# Patient Record
Sex: Male | Born: 1970
Health system: Southern US, Community
[De-identification: ages and names within clinical notes are randomized; demographics above are authoritative.]

## PROBLEM LIST (undated history)

## (undated) DIAGNOSIS — I10 Essential (primary) hypertension: Secondary | ICD-10-CM

---

## 1998-02-08 DIAGNOSIS — I10 Essential (primary) hypertension: Secondary | ICD-10-CM

## 1998-02-08 HISTORY — DX: Essential (primary) hypertension: I10

## 2007-03-18 ENCOUNTER — Emergency Department (HOSPITAL_COMMUNITY): Admission: EM | Admit: 2007-03-18 | Discharge: 2007-03-18 | Payer: Self-pay | Admitting: Emergency Medicine

## 2007-07-17 ENCOUNTER — Emergency Department (HOSPITAL_COMMUNITY): Admission: EM | Admit: 2007-07-17 | Discharge: 2007-07-17 | Payer: Self-pay | Admitting: Emergency Medicine

## 2009-02-25 ENCOUNTER — Emergency Department (HOSPITAL_COMMUNITY): Admission: EM | Admit: 2009-02-25 | Discharge: 2009-02-25 | Payer: Self-pay | Admitting: Emergency Medicine

## 2009-08-25 ENCOUNTER — Emergency Department (HOSPITAL_COMMUNITY): Admission: EM | Admit: 2009-08-25 | Discharge: 2009-08-26 | Payer: Self-pay | Admitting: Emergency Medicine

## 2009-08-25 ENCOUNTER — Ambulatory Visit: Payer: Self-pay | Admitting: Psychiatry

## 2010-01-15 ENCOUNTER — Inpatient Hospital Stay (HOSPITAL_COMMUNITY): Admission: EM | Admit: 2010-01-15 | Discharge: 2009-08-28 | Payer: Self-pay | Admitting: Psychiatry

## 2010-04-25 LAB — CBC
Hemoglobin: 15.2 g/dL (ref 13.0–17.0)
MCH: 31.3 pg (ref 26.0–34.0)
MCHC: 34.8 g/dL (ref 30.0–36.0)
Platelets: 202 10*3/uL (ref 150–400)
RBC: 4.85 MIL/uL (ref 4.22–5.81)
RDW: 13.5 % (ref 11.5–15.5)
WBC: 13.3 10*3/uL — ABNORMAL HIGH (ref 4.0–10.5)

## 2010-04-25 LAB — POCT I-STAT, CHEM 8
BUN: 11 mg/dL (ref 6–23)
Calcium, Ion: 1.07 mmol/L — ABNORMAL LOW (ref 1.12–1.32)
Chloride: 104 mEq/L (ref 96–112)
Creatinine, Ser: 1.3 mg/dL (ref 0.4–1.5)
HCT: 47 % (ref 39.0–52.0)
Hemoglobin: 16 g/dL (ref 13.0–17.0)
Potassium: 3.7 mEq/L (ref 3.5–5.1)
Sodium: 136 mEq/L (ref 135–145)

## 2010-04-25 LAB — DIFFERENTIAL
Basophils Relative: 0 % (ref 0–1)
Eosinophils Relative: 0 % (ref 0–5)
Monocytes Relative: 3 % (ref 3–12)

## 2010-04-25 LAB — URINALYSIS, ROUTINE W REFLEX MICROSCOPIC
Hgb urine dipstick: NEGATIVE
Specific Gravity, Urine: 1.015 (ref 1.005–1.030)
pH: 5 (ref 5.0–8.0)

## 2010-04-25 LAB — RAPID URINE DRUG SCREEN, HOSP PERFORMED
Amphetamines: NOT DETECTED
Barbiturates: NOT DETECTED
Benzodiazepines: POSITIVE — AB
Opiates: NOT DETECTED
Tetrahydrocannabinol: POSITIVE — AB

## 2010-04-25 LAB — ETHANOL: Alcohol, Ethyl (B): 24 mg/dL — ABNORMAL HIGH (ref 0–10)

## 2010-04-26 LAB — URINALYSIS, ROUTINE W REFLEX MICROSCOPIC
Bilirubin Urine: NEGATIVE
Ketones, ur: NEGATIVE mg/dL
Specific Gravity, Urine: 1.018 (ref 1.005–1.030)
Urobilinogen, UA: 0.2 mg/dL (ref 0.0–1.0)

## 2010-04-26 LAB — COMPREHENSIVE METABOLIC PANEL
AST: 25 U/L (ref 0–37)
Albumin: 4 g/dL (ref 3.5–5.2)
Alkaline Phosphatase: 54 U/L (ref 39–117)
Calcium: 9.1 mg/dL (ref 8.4–10.5)
Potassium: 3.8 mEq/L (ref 3.5–5.1)
Total Bilirubin: 0.6 mg/dL (ref 0.3–1.2)
Total Protein: 7.4 g/dL (ref 6.0–8.3)

## 2010-04-26 LAB — DIFFERENTIAL
Basophils Relative: 1 % (ref 0–1)
Eosinophils Relative: 0 % (ref 0–5)
Lymphocytes Relative: 18 % (ref 12–46)
Lymphs Abs: 1.9 10*3/uL (ref 0.7–4.0)
Monocytes Relative: 6 % (ref 3–12)

## 2010-04-26 LAB — CBC
HCT: 44.9 % (ref 39.0–52.0)
Hemoglobin: 15.3 g/dL (ref 13.0–17.0)
MCHC: 34.1 g/dL (ref 30.0–36.0)
Platelets: 186 10*3/uL (ref 150–400)
RBC: 4.94 MIL/uL (ref 4.22–5.81)

## 2010-04-26 LAB — PROTIME-INR: Prothrombin Time: 12.5 seconds (ref 11.6–15.2)

## 2010-04-26 LAB — APTT: aPTT: 30 seconds (ref 24–37)

## 2010-04-26 LAB — POCT CARDIAC MARKERS
CKMB, poc: <1 ng/mL — ABNORMAL LOW (ref 1.0–8.0)
Myoglobin, poc: 70.7 ng/mL (ref 12–200)

## 2010-10-22 IMAGING — CR DG CHEST 2V
2 series · 2 of 2 positions shown · non-contrast
Comparison: 03/18/2007

CLINICAL DATA: Headache, left neck and shoulder pressure,
hypertension

CHEST - 2 VIEW

[w chest pa]
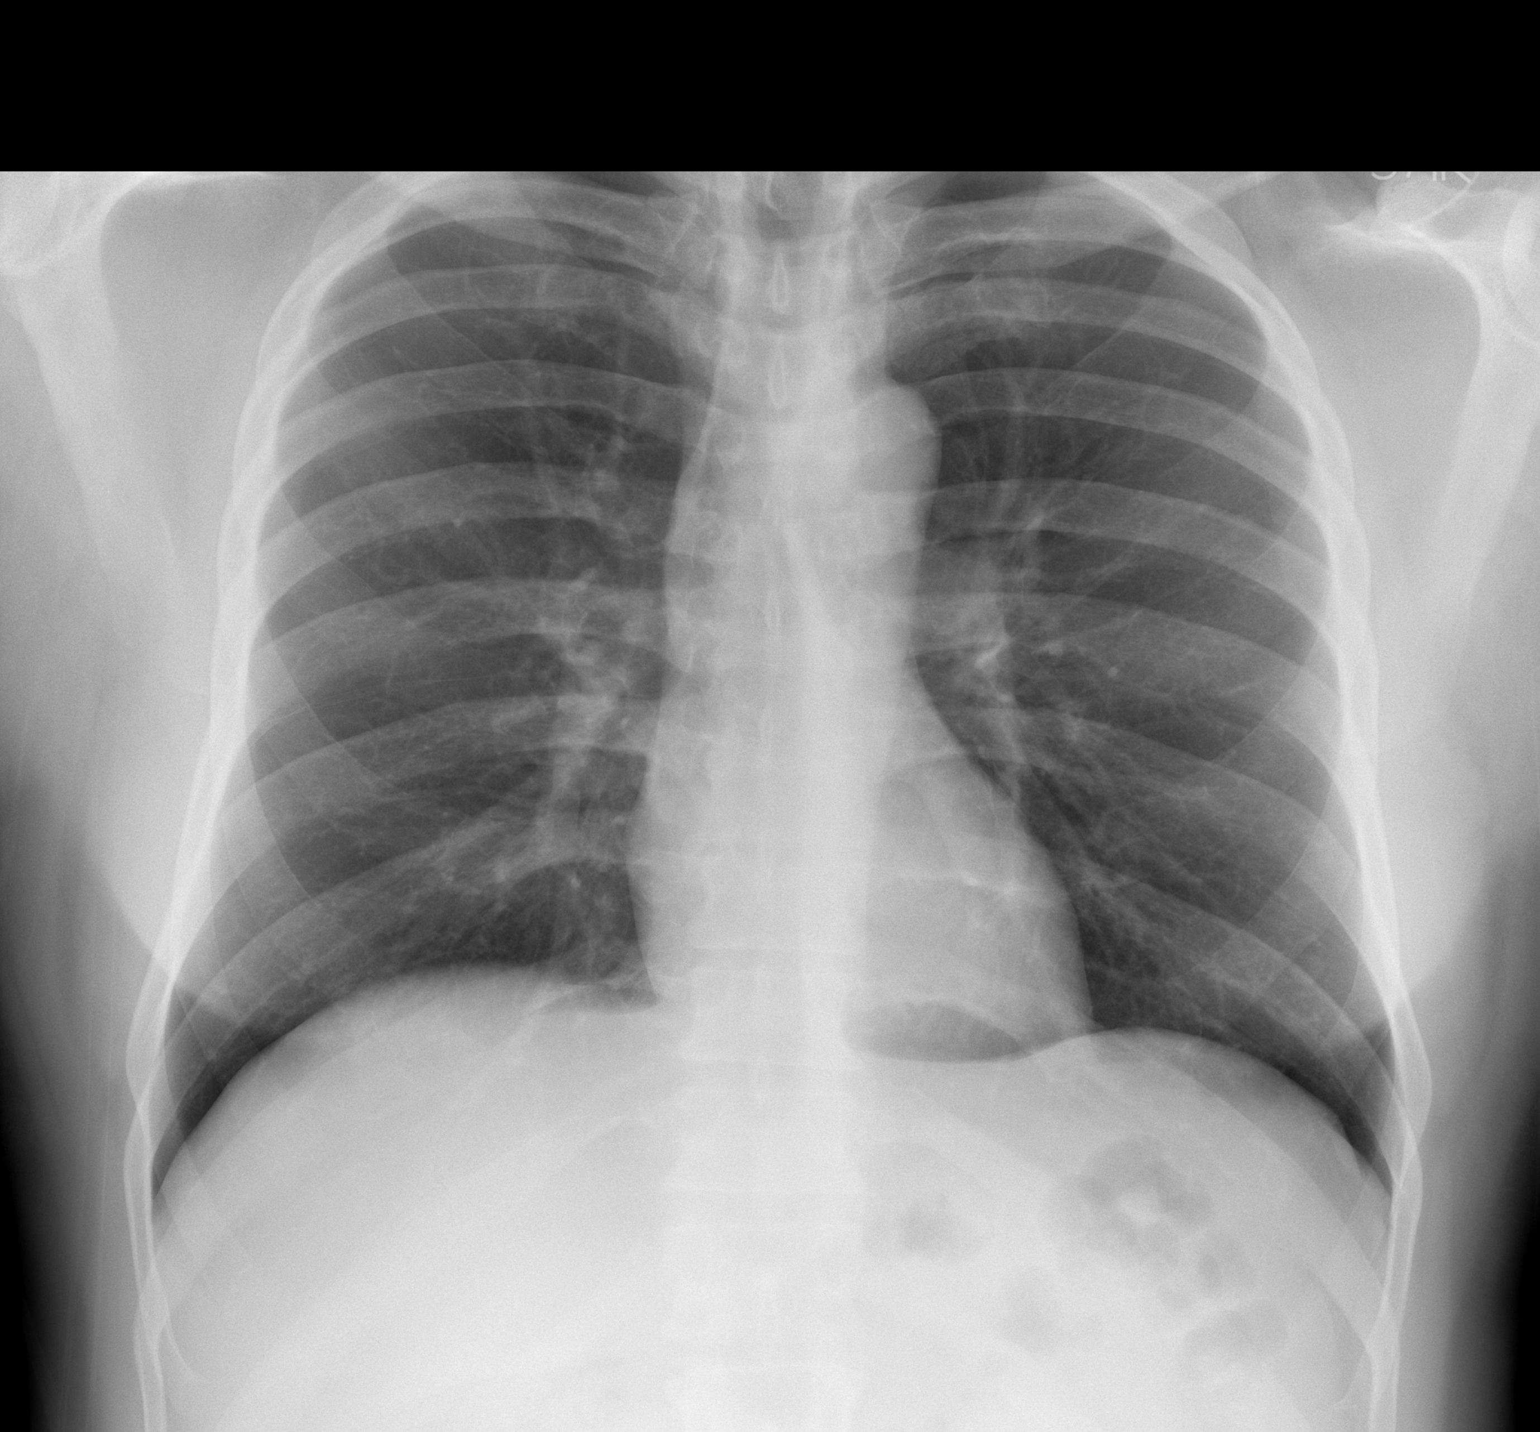

[w chest lat]
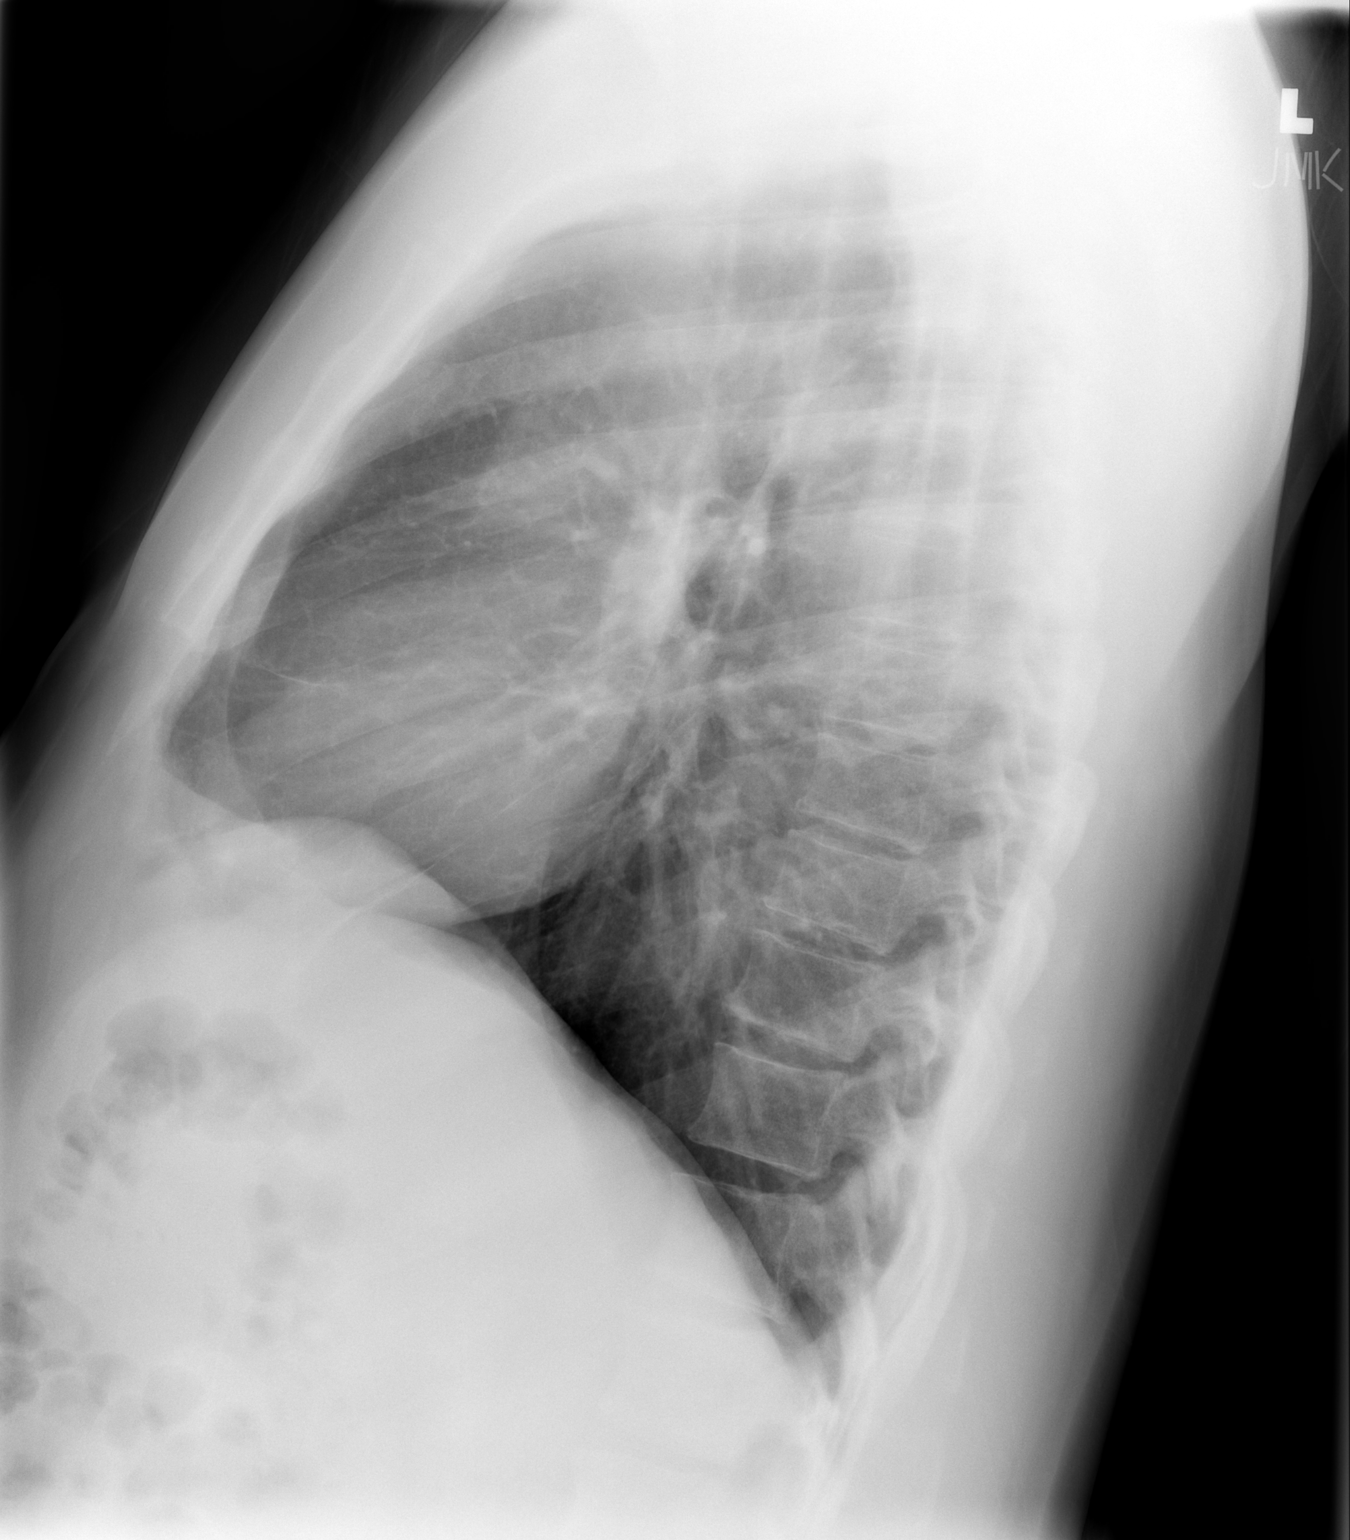

[2 of 2 positions shown; findings below may reference images not displayed]

FINDINGS: The heart size and mediastinal contours are within
normal limits.  Both lungs are clear.  The visualized skeletal
structures are unremarkable.
IMPRESSION: No active cardiopulmonary disease.

## 2010-10-22 IMAGING — CT CT HEAD W/O CM
1 of 2 series · 13 of 30 positions shown, 17 images · non-contrast
Comparison: None.

CLINICAL DATA: Hypertension, headache, shortness of breath

CT HEAD WITHOUT CONTRAST
TECHNIQUE: Contiguous axial images were obtained from the base of
the skull through the vertex without contrast

[Series 2: brain · axial · 0.49mm/px · z∈[+144,+278]mm · 13 of 32 slices shown, 17 images]
[im 3/32  brain]
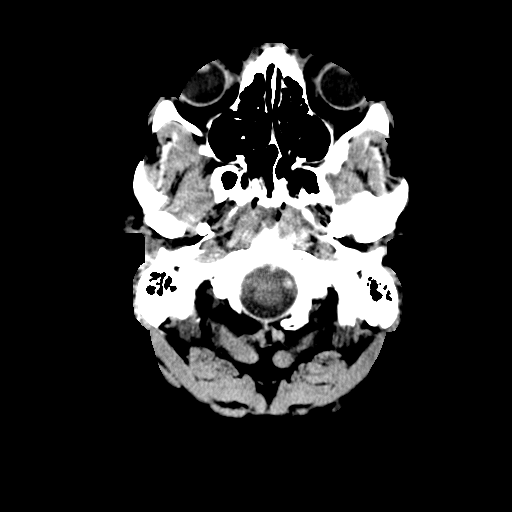
[im 3/32  bone]
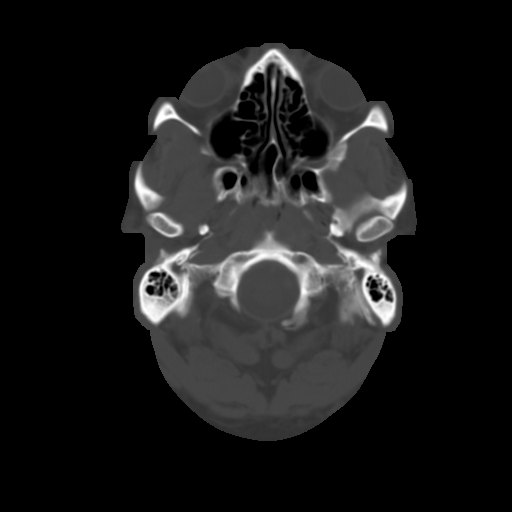
[im 5/32  brain]
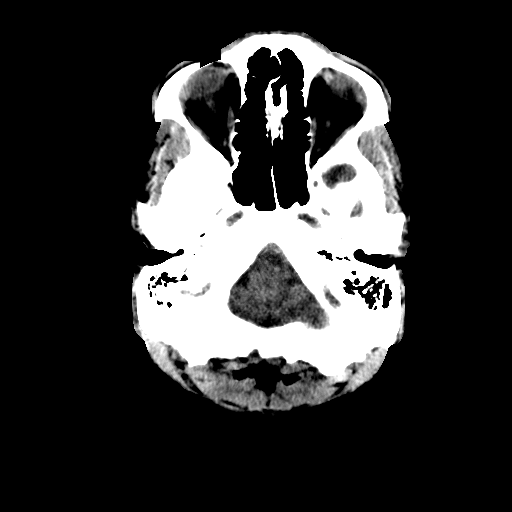
[im 7/32  brain]
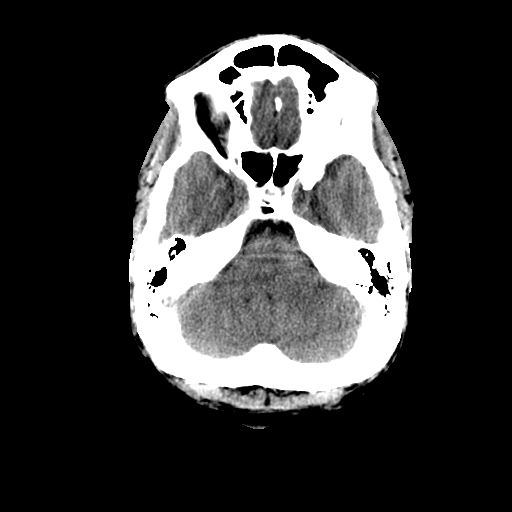
[im 9/32  brain]
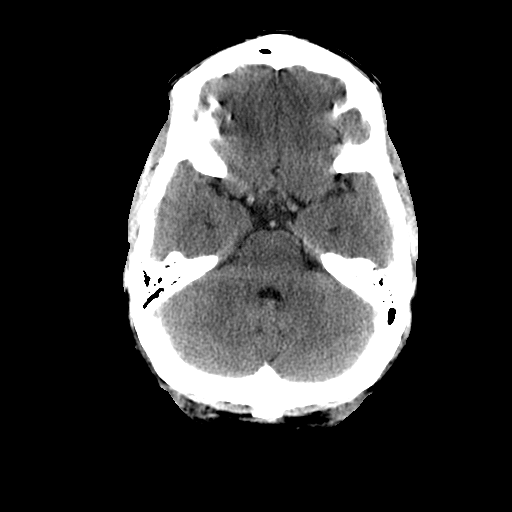
[im 12/32  brain]
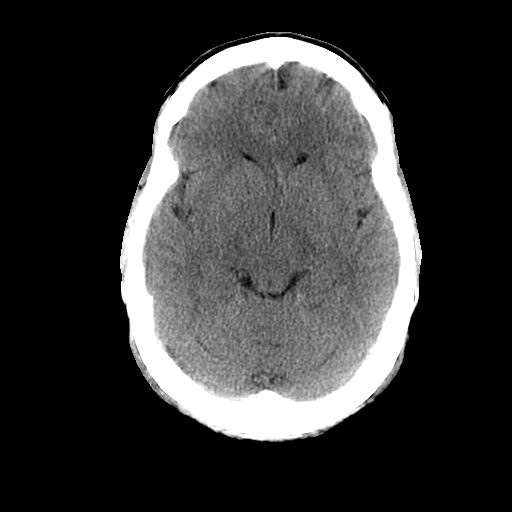
[im 12/32  bone]
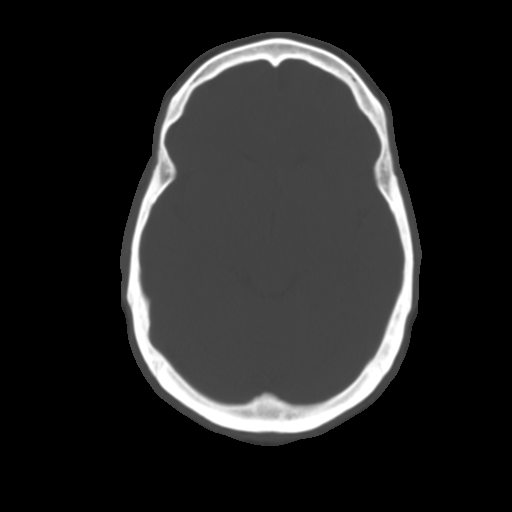
[im 14/32  brain]
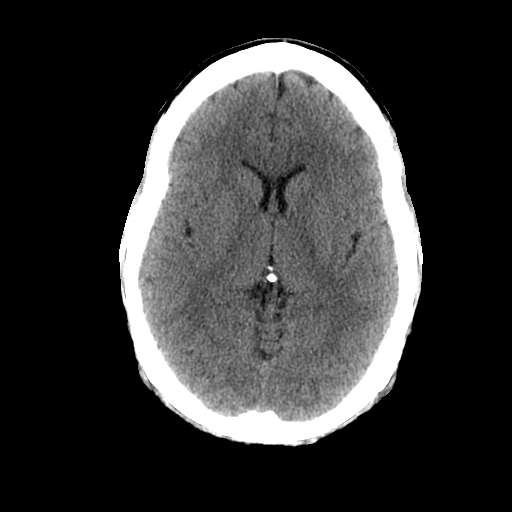
[im 16/32  brain]
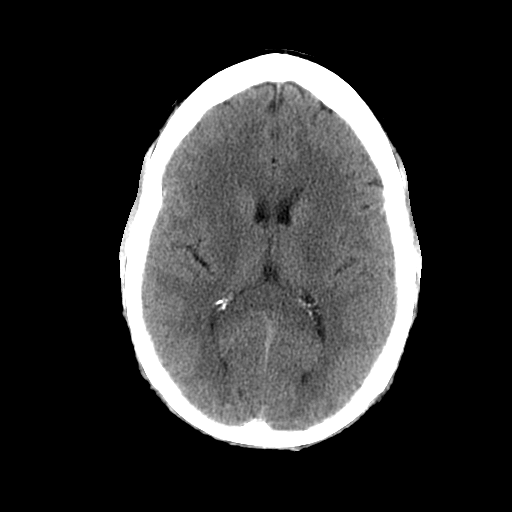
[im 18/32  brain]
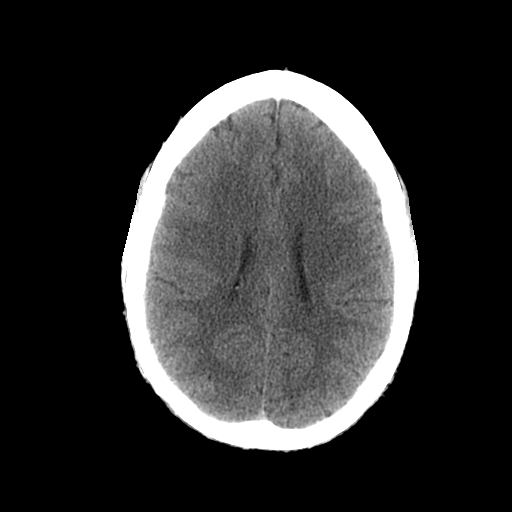
[im 20/32  brain]
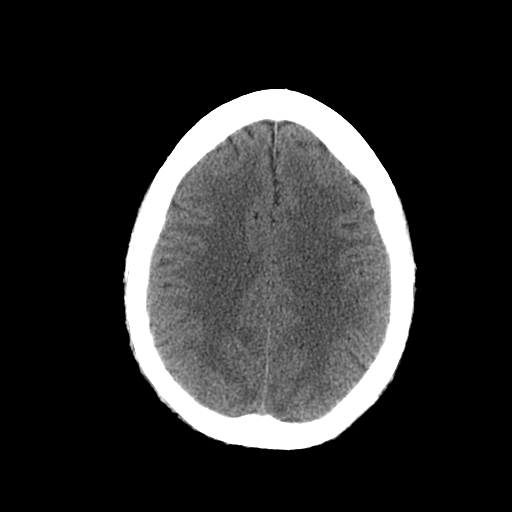
[im 20/32  bone]
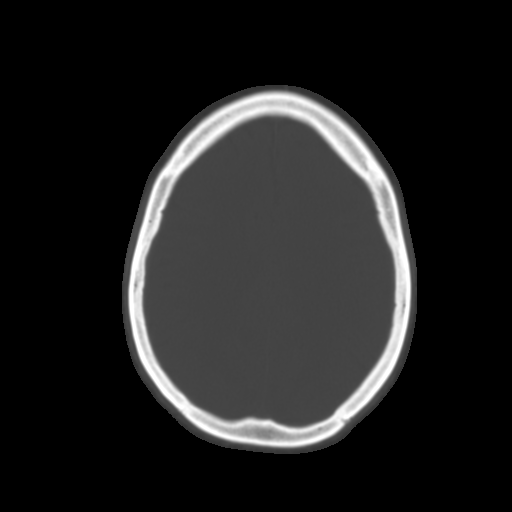
[im 23/32  brain]
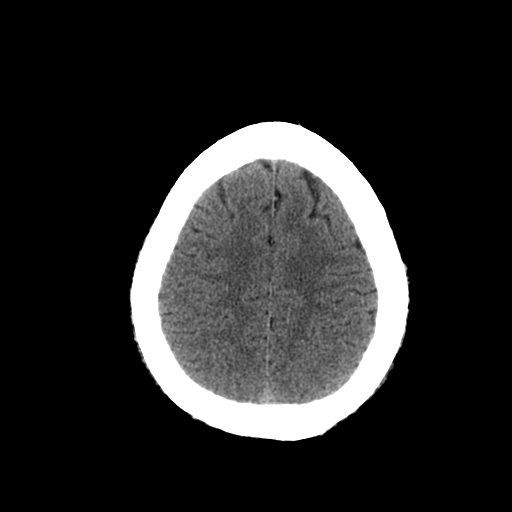
[im 25/32  brain]
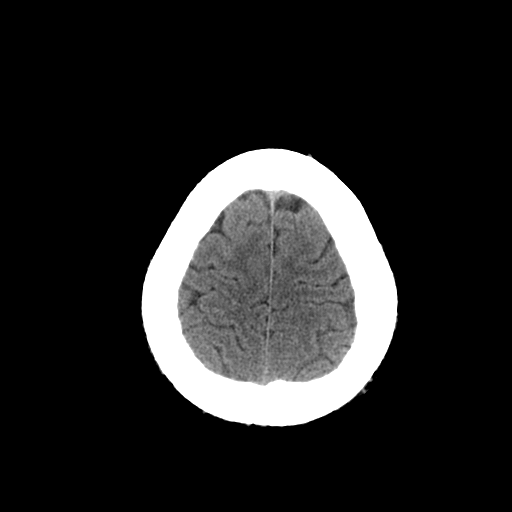
[im 27/32  brain]
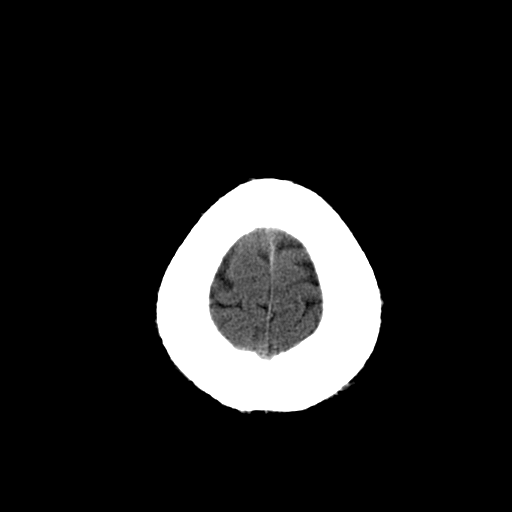
[im 29/32  brain]
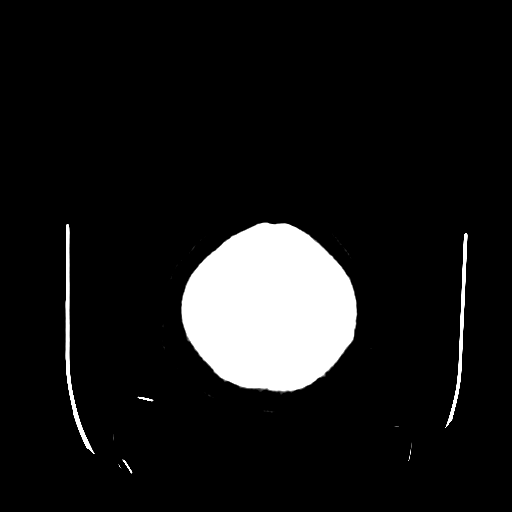
[im 29/32  bone]
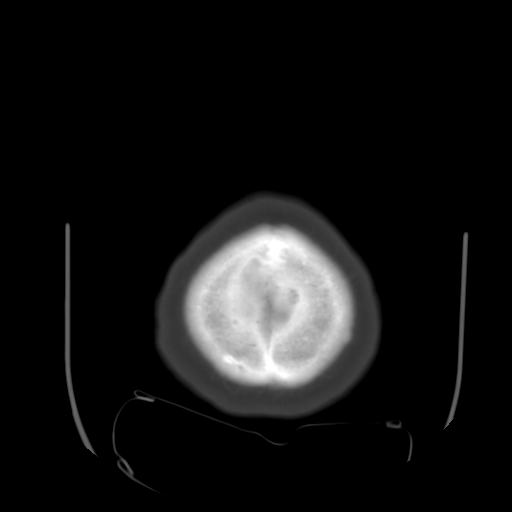

[13 of 30 positions shown; findings below may reference images not displayed]

FINDINGS: The brain has a normal appearance without evidence for
hemorrhage, acute infarction, hydrocephalus, or mass lesion.  There
is no extra axial fluid collection.  The skull and paranasal
sinuses are normal.
IMPRESSION: Normal CT of the head without contrast.

## 2012-05-26 ENCOUNTER — Encounter (HOSPITAL_COMMUNITY): Payer: Self-pay | Admitting: Emergency Medicine

## 2012-05-26 ENCOUNTER — Emergency Department (HOSPITAL_COMMUNITY)
Admission: EM | Admit: 2012-05-26 | Discharge: 2012-05-26 | Disposition: A | Discharge status: Home / Self Care | Payer: Self-pay | Attending: Emergency Medicine | Admitting: Emergency Medicine

## 2012-05-26 DIAGNOSIS — H538 Other visual disturbances: Secondary | ICD-10-CM | POA: Insufficient documentation

## 2012-05-26 DIAGNOSIS — R42 Dizziness and giddiness: Secondary | ICD-10-CM | POA: Insufficient documentation

## 2012-05-26 DIAGNOSIS — I10 Essential (primary) hypertension: Secondary | ICD-10-CM | POA: Insufficient documentation

## 2012-05-26 DIAGNOSIS — F329 Major depressive disorder, single episode, unspecified: Secondary | ICD-10-CM | POA: Insufficient documentation

## 2012-05-26 DIAGNOSIS — F17200 Nicotine dependence, unspecified, uncomplicated: Secondary | ICD-10-CM | POA: Insufficient documentation

## 2012-05-26 DIAGNOSIS — F3289 Other specified depressive episodes: Secondary | ICD-10-CM | POA: Insufficient documentation

## 2012-05-26 HISTORY — DX: Essential (primary) hypertension: I10

## 2012-05-26 LAB — POCT I-STAT, CHEM 8
Calcium, Ion: 1.25 mmol/L — ABNORMAL HIGH (ref 1.12–1.23)
Chloride: 106 mEq/L (ref 96–112)
Creatinine, Ser: 1.1 mg/dL (ref 0.50–1.35)
Glucose, Bld: 108 mg/dL — ABNORMAL HIGH (ref 70–99)
Potassium: 3.5 mEq/L (ref 3.5–5.1)
TCO2: 23 mmol/L (ref 0–100)

## 2012-05-26 MED ORDER — AMLODIPINE BESYLATE 5 MG PO TABS
5.0000 mg | ORAL_TABLET | Freq: Once | ORAL | Status: DC
  Filled 2012-05-26: qty 1

## 2012-05-26 MED ORDER — LISINOPRIL-HYDROCHLOROTHIAZIDE 20-25 MG PO TABS: 1.0000 | ORAL_TABLET | Freq: Every day | ORAL | Status: DC

## 2012-05-26 MED ORDER — SERTRALINE HCL 50 MG PO TABS: 50.0000 mg | ORAL_TABLET | Freq: Every day | ORAL | Status: DC

## 2012-05-26 MED ORDER — HYDROCHLOROTHIAZIDE 25 MG PO TABS
25.0000 mg | ORAL_TABLET | Freq: Once | ORAL | Status: AC
  Administered 2012-05-26: 25 mg via ORAL
  Filled 2012-05-26: qty 1

## 2012-05-26 MED ORDER — LISINOPRIL 20 MG PO TABS
20.0000 mg | ORAL_TABLET | Freq: Once | ORAL | Status: AC
  Administered 2012-05-26: 20 mg via ORAL
  Filled 2012-05-26: qty 1

## 2012-05-26 NOTE — ED Notes (Addendum)
Pt c/o intermittent headaches and blurred vision x 1 month. Pt reports being out of blood pressure medications for months. Pt does not have a PMD. Pt also reports being out of his antidepressant (Zoloft) medications x 1 year and is requesting a prescription. Pt denies SI/HI.

## 2012-05-26 NOTE — ED Provider Notes (Signed)
I saw and evaluated the patient, reviewed the resident's note and I agree with the findings and plan.   .Face to face Exam:  General:  Awake HEENT:  Atraumatic Resp:  Normal effort Abd:  Nondistended Neuro:No focal weakness    Nelia Shi, MD 05/26/12 1007

## 2012-05-26 NOTE — ED Provider Notes (Signed)
History     CSN: [id number]  Arrival date & time 05/26/12  1308   First MD Initiated Contact with Patient 05/26/12 [phone]      Chief Complaint  Patient presents with  . Headache  . Blurred Vision    (Consider location/radiation/quality/duration/timing/severity/associated sxs/prior treatment) HPI 42 yo M with HTN and depression presenting with intermittent headaches and blurry vision.  He states that he used to be on Norvasc and something that started with an L for his blood pressure, but he has not taken any of these medicines for at least 8 months due to losing his insurance.   The headaches are diffuse and throbbing, no associated photophobia or nausea.  They respond some to tylenol and motrin.  The blurry vision is intermittent and can make him feel a little dizzy.  Denies chest pain, shortness of breath, nausea, vomiting, gait abnormality, weakness.  He is also asking about getting back on Zoloft for his depression.  He has also been off of this for at least 8 months.  No SI/HI.   Past Medical History  Diagnosis Date  . Hypertension     History reviewed. No pertinent past surgical history.  No family history on file.  History  Substance Use Topics  . Smoking status: Current Every Day Smoker  . Smokeless tobacco: Not on file  . Alcohol Use: Yes      Review of Systems  Constitutional: Negative.   Eyes: Positive for visual disturbance. Negative for photophobia and pain.  Respiratory: Negative.   Cardiovascular: Negative.   Gastrointestinal: Negative.   Genitourinary: Negative.   Musculoskeletal: Negative.   Skin: Negative.   Neurological: Positive for dizziness and headaches. Negative for syncope, speech difficulty and weakness.    Allergies  Penicillins  Home Medications  No current outpatient prescriptions on file.  BP 186/129  Pulse 81  Temp(Src) 98.6 F (37 C) (Oral)  Resp 20  SpO2 96%  Physical Exam  Constitutional: He is oriented to person, place,  and time. He appears well-developed and well-nourished. No distress.  HENT:  Head: Normocephalic and atraumatic.  Right Ear: External ear normal.  Left Ear: External ear normal.  Mouth/Throat: Oropharynx is clear and moist. No oropharyngeal exudate.  Eyes: Conjunctivae and EOM are normal. Pupils are equal, round, and reactive to light. Right eye exhibits no discharge. Left eye exhibits no discharge. No scleral icterus.  Fundoscopic exam:      The right eye shows no hemorrhage and no papilledema.       The left eye shows no hemorrhage and no papilledema.  Cardiovascular: Normal rate, regular rhythm, normal heart sounds and intact distal pulses.   No murmur heard. Pulmonary/Chest: Effort normal and breath sounds normal. No respiratory distress. He has no wheezes. He has no rales.  Musculoskeletal: He exhibits no edema and no tenderness.  Neurological: He is alert and oriented to person, place, and time. No cranial nerve deficit. He exhibits normal muscle tone. Coordination normal.  Skin: Skin is warm and dry. No rash noted. He is not diaphoretic. No erythema.    ED Course  Procedures (including critical care time)  Labs Reviewed  POCT I-STAT, CHEM 8 - Abnormal; Notable for the following:    Glucose, Bld 108 (*)    Calcium, Ion 1.25 (*)    All other components within normal limits   No results found.   No diagnosis found.    MDM  42 yo M with HTN and depression who has been out of  medication for at least 8 months presenting with intermittent headaches and blurred vision.  BP 180s/120s on arrival.   No sign of hypertensive emergencies on exam.  Will check an i-stat for creatinine.  I am not sure if the headaches are related to spikes in his blood pressure or are tension vs migraine headaches.  If creatinine is okay, will start HCTZ 25mg  and Lisinopril 20mg  daily.  9:15AM: Patient reports feeling better.  He is anxious to leave, but has not received the BP meds yet.  Will give the  meds and recheck BP after 30 minutes.  If BP is stable to decreased, okay to discharge.  10:00AM: Blood pressure is stable and patient is asymptomatic.  Will d/c.   Phebe Colla, MD 05/26/12 1005

## 2013-02-02 ENCOUNTER — Emergency Department (HOSPITAL_COMMUNITY)
Admission: EM | Admit: 2013-02-02 | Discharge: 2013-02-02 | Disposition: A | Discharge status: Home / Self Care | Payer: Self-pay | Attending: Emergency Medicine | Admitting: Emergency Medicine

## 2013-02-02 ENCOUNTER — Encounter (HOSPITAL_COMMUNITY): Payer: Self-pay | Admitting: Emergency Medicine

## 2013-02-02 DIAGNOSIS — Z760 Encounter for issue of repeat prescription: Secondary | ICD-10-CM | POA: Insufficient documentation

## 2013-02-02 DIAGNOSIS — I10 Essential (primary) hypertension: Secondary | ICD-10-CM | POA: Insufficient documentation

## 2013-02-02 DIAGNOSIS — H538 Other visual disturbances: Secondary | ICD-10-CM | POA: Insufficient documentation

## 2013-02-02 DIAGNOSIS — Z880 Allergy status to penicillin: Secondary | ICD-10-CM | POA: Insufficient documentation

## 2013-02-02 DIAGNOSIS — F17200 Nicotine dependence, unspecified, uncomplicated: Secondary | ICD-10-CM | POA: Insufficient documentation

## 2013-02-02 DIAGNOSIS — R51 Headache: Secondary | ICD-10-CM | POA: Insufficient documentation

## 2013-02-02 DIAGNOSIS — Z79899 Other long term (current) drug therapy: Secondary | ICD-10-CM | POA: Insufficient documentation

## 2013-02-02 LAB — POCT I-STAT, CHEM 8
BUN: 16 mg/dL (ref 6–23)
Calcium, Ion: 1.22 mmol/L (ref 1.12–1.23)
Chloride: 103 mEq/L (ref 96–112)
Glucose, Bld: 109 mg/dL — ABNORMAL HIGH (ref 70–99)
HCT: 46 % (ref 39.0–52.0)
Hemoglobin: 15.6 g/dL (ref 13.0–17.0)
TCO2: 24 mmol/L (ref 0–100)

## 2013-02-02 MED ORDER — LISINOPRIL-HYDROCHLOROTHIAZIDE 20-25 MG PO TABS: 1.0000 | ORAL_TABLET | Freq: Every day | ORAL | Status: DC

## 2013-02-02 MED ORDER — SERTRALINE HCL 50 MG PO TABS: 50.0000 mg | ORAL_TABLET | Freq: Every day | ORAL | Status: DC

## 2013-02-02 NOTE — ED Notes (Signed)
Patient refused discharge vital signs. 

## 2013-02-02 NOTE — ED Provider Notes (Signed)
CSN: [id number]     Arrival date & time 02/02/13  7829 History   First MD Initiated Contact with Patient 02/02/13 [phone]     Chief Complaint  Patient presents with  . Hypertension   (Consider location/radiation/quality/duration/timing/severity/associated sxs/prior Treatment) HPI Patient with hx hypertension out of his medications for the past 6 months, p/w intermittent headaches and blurry vision, occasional sparkles in his visual field.  States that for the past several weeks he has headaches at night, located in the occiput, has applied ice without improvement, headaches are resolved during the day.  Notes he has also had blurry vision x several weeks.  Denies CP, SOB, leg swelling, palpitations, cough, focal neurological deficits.  Requests refills of BP medications and zoloft. Pt states he does have some depression, denies SI, HI.     Past Medical History  Diagnosis Date  . Hypertension    History reviewed. No pertinent past surgical history. No family history on file. History  Substance Use Topics  . Smoking status: Current Every Day Smoker  . Smokeless tobacco: Not on file  . Alcohol Use: Yes    Review of Systems  Constitutional: Negative for fever.  HENT: Negative for sore throat and trouble swallowing.   Eyes: Positive for visual disturbance (blurry, no diplopia).  Respiratory: Negative for cough and shortness of breath.   Cardiovascular: Negative for chest pain.  Gastrointestinal: Negative for nausea, vomiting, abdominal pain and diarrhea.  Musculoskeletal: Negative for gait problem.  Neurological: Positive for headaches. Negative for dizziness, syncope, weakness and numbness.  Psychiatric/Behavioral: Negative for suicidal ideas and confusion.    Allergies  Penicillins  Home Medications   Current Outpatient Rx  Name  Route  Sig  Dispense  Refill  . acetaminophen (TYLENOL) 500 MG tablet   Oral   Take 500 mg by mouth every 6 (six) hours as needed for pain.          Marland Kitchen ibuprofen (ADVIL,MOTRIN) 800 MG tablet   Oral   Take 800 mg by mouth daily as needed for pain.         Marland Kitchen lisinopril-hydrochlorothiazide (PRINZIDE,ZESTORETIC) 20-25 MG per tablet   Oral   Take 1 tablet by mouth daily.   30 tablet   1   . sertraline (ZOLOFT) 50 MG tablet   Oral   Take 1 tablet (50 mg total) by mouth daily.   30 tablet   1    BP 167/118  Pulse 87  Temp(Src) 98.5 F (36.9 C)  Resp 16  SpO2 99% Physical Exam  Nursing note and vitals reviewed. Constitutional: He appears well-developed and well-nourished. No distress.  HENT:  Head: Normocephalic and atraumatic.  Eyes: Conjunctivae and EOM are normal.  Fundoscopic exam is limited but without major abnormalities  Neck: Neck supple.  Cardiovascular: Normal rate, regular rhythm and intact distal pulses.   Pulmonary/Chest: Effort normal and breath sounds normal. No respiratory distress. He has no wheezes. He has no rales.  Abdominal: Soft. He exhibits no distension and no mass. There is no tenderness. There is no rebound and no guarding.  Musculoskeletal: Normal range of motion. He exhibits no edema.  Neurological: He is alert. He exhibits normal muscle tone.  CN II-XII intact, EOMs intact, no pronator drift, grip strengths equal bilaterally; strength 5/5 in all extremities, sensation intact in all extremities; finger to nose, heel to shin, rapid alternating movements normal; gait is normal.     Skin: He is not diaphoretic.  Psychiatric: He has a normal mood and  affect. His behavior is normal. Thought content normal.    ED Course  Procedures (including critical care time) Labs Review Labs Reviewed - No data to display Imaging Review No results found.  EKG Interpretation   None       MDM   1. Hypertension   2. Medication refill      Pt with known HTN, off of his medications due to lack of insurance and no PCP.  Will have insurance and PCP within a few weeks.  Neuro, cardiac exams normal.  Chem  8 unremarkable except for mildly elevated glucose.  D/C home with refills, PCP follow up.  Discussed result, findings, treatment, and follow up  with patient.  Pt given return precautions.  Pt verbalizes understanding and agrees with plan.      I doubt any other EMC precluding discharge at this time including, but not necessarily limited to the following: hypertensive emergency      Trixie Dredge, PA-C 02/02/13 [phone]

## 2013-02-02 NOTE — ED Provider Notes (Signed)
Medical screening examination/treatment/procedure(s) were performed by non-physician practitioner and as supervising physician I was immediately available for consultation/collaboration.  EKG Interpretation   None         Kristen N Ward, DO 02/02/13 1444 

## 2013-02-02 NOTE — ED Notes (Addendum)
Pt c/o hypertension.  Pt on different BP meds, but has struggled to keep BP under control.  Pt states he ran out of BP meds in May.

## 2013-04-03 ENCOUNTER — Encounter (HOSPITAL_COMMUNITY): Payer: Self-pay | Admitting: Emergency Medicine

## 2013-04-03 ENCOUNTER — Emergency Department (HOSPITAL_COMMUNITY)
Admission: EM | Admit: 2013-04-03 | Discharge: 2013-04-03 | Disposition: A | Discharge status: Home / Self Care | Payer: Self-pay | Attending: Emergency Medicine | Admitting: Emergency Medicine

## 2013-04-03 DIAGNOSIS — M5442 Lumbago with sciatica, left side: Secondary | ICD-10-CM

## 2013-04-03 DIAGNOSIS — Z0289 Encounter for other administrative examinations: Secondary | ICD-10-CM | POA: Insufficient documentation

## 2013-04-03 DIAGNOSIS — I10 Essential (primary) hypertension: Secondary | ICD-10-CM | POA: Insufficient documentation

## 2013-04-03 DIAGNOSIS — M5430 Sciatica, unspecified side: Secondary | ICD-10-CM | POA: Insufficient documentation

## 2013-04-03 DIAGNOSIS — Z880 Allergy status to penicillin: Secondary | ICD-10-CM | POA: Insufficient documentation

## 2013-04-03 DIAGNOSIS — Z79899 Other long term (current) drug therapy: Secondary | ICD-10-CM | POA: Insufficient documentation

## 2013-04-03 DIAGNOSIS — IMO0001 Reserved for inherently not codable concepts without codable children: Secondary | ICD-10-CM | POA: Insufficient documentation

## 2013-04-03 DIAGNOSIS — Z760 Encounter for issue of repeat prescription: Secondary | ICD-10-CM | POA: Insufficient documentation

## 2013-04-03 DIAGNOSIS — F17200 Nicotine dependence, unspecified, uncomplicated: Secondary | ICD-10-CM | POA: Insufficient documentation

## 2013-04-03 MED ORDER — LISINOPRIL-HYDROCHLOROTHIAZIDE 20-25 MG PO TABS: 1.0000 | ORAL_TABLET | Freq: Every day | ORAL | Status: DC

## 2013-04-03 MED ORDER — TRAMADOL HCL 50 MG PO TABS: 50.0000 mg | ORAL_TABLET | Freq: Four times a day (QID) | ORAL | Status: DC | PRN

## 2013-04-03 MED ORDER — CYCLOBENZAPRINE HCL 10 MG PO TABS: 10.0000 mg | ORAL_TABLET | Freq: Two times a day (BID) | ORAL | Status: DC | PRN

## 2013-04-03 MED ORDER — LISINOPRIL 20 MG PO TABS
20.0000 mg | ORAL_TABLET | Freq: Every day | ORAL | Status: DC
  Administered 2013-04-03: 20 mg via ORAL
  Filled 2013-04-03 (×2): qty 1

## 2013-04-03 MED ORDER — HYDROCHLOROTHIAZIDE 25 MG PO TABS
25.0000 mg | ORAL_TABLET | Freq: Every day | ORAL | Status: DC
  Administered 2013-04-03: 25 mg via ORAL
  Filled 2013-04-03: qty 1

## 2013-04-03 MED ORDER — SERTRALINE HCL 50 MG PO TABS: 50.0000 mg | ORAL_TABLET | Freq: Every day | ORAL | Status: DC

## 2013-04-03 NOTE — Discharge Instructions (Signed)
You may take pain medication as needed for back pain when acetaminophen and ibuprofen not helping. Please do not take pain medication while driving or operating heavy machinery as this medication causes drowsiness.  Be sure to follow up with primary care for recheck of lower back pain, hypertension, and medication refills.

## 2013-04-03 NOTE — ED Notes (Signed)
Pt reports lower back pain for 3 days. Denies injury. Hurts worst when he bends over to tie his shoes. Also need refill of lisinopril has been out for 1 week. Denies any CP. Pt is a x 4.

## 2013-04-03 NOTE — ED Provider Notes (Signed)
CSN: [id number]     Arrival date & time 04/03/13  1715 History   First MD Initiated Contact with Patient 04/03/13 1804     Chief Complaint  Patient presents with  . Back Pain  . Medication Refill     (Consider location/radiation/quality/duration/timing/severity/associated sxs/prior Treatment) HPI Pt is a 43yo male with hx of HTN presenting today with left lower back pain that started 3-5 days ago but gradually improving. Pt states left lower back is aching and sore, worse with certain movements. States pain at rest is 7/10, radiating into left leg stopping at his knee.  Has been taking tylenol with moderate relief. Denies known injury. Denies fever, n/v/d. Denies difficulty walking or change in bowel or bladder habits. No hx of cancer or IVDU. Pt also requesting work note to return to work and refill on medications.   Past Medical History  Diagnosis Date  . Hypertension    History reviewed. No pertinent past surgical history. No family history on file. History  Substance Use Topics  . Smoking status: Current Every Day Smoker  . Smokeless tobacco: Not on file  . Alcohol Use: Yes    Review of Systems  Constitutional: Negative for fever and chills.  Gastrointestinal: Negative for nausea, vomiting and diarrhea.  Musculoskeletal: Positive for arthralgias, back pain and myalgias. Negative for joint swelling, neck pain and neck stiffness.  Neurological: Negative for weakness and numbness.  All other systems reviewed and are negative.      Allergies  Penicillins and Other  Home Medications   Current Outpatient Rx  Name  Route  Sig  Dispense  Refill  . cyclobenzaprine (FLEXERIL) 10 MG tablet   Oral   Take 1 tablet (10 mg total) by mouth 2 (two) times daily as needed for muscle spasms.   20 tablet   0   . lisinopril-hydrochlorothiazide (PRINZIDE,ZESTORETIC) 20-25 MG per tablet   Oral   Take 1 tablet by mouth daily.   30 tablet   1   . sertraline (ZOLOFT) 50 MG tablet  Oral   Take 1 tablet (50 mg total) by mouth daily.   30 tablet   1   . traMADol (ULTRAM) 50 MG tablet   Oral   Take 1 tablet (50 mg total) by mouth every 6 (six) hours as needed.   15 tablet   0    BP 156/109  Pulse 74  Temp(Src) 99 F (37.2 C) (Oral)  Resp 16  Wt 236 lb 8 oz (107.276 kg)  SpO2 100% Physical Exam  Nursing note and vitals reviewed. Constitutional: He is oriented to person, place, and time. He appears well-developed and well-nourished.  Pt lying comfortably in exam bed, NAD.   HENT:  Head: Normocephalic and atraumatic.  Eyes: Conjunctivae are normal. No scleral icterus.  Neck: Normal range of motion. Neck supple.  Cardiovascular: Normal rate, regular rhythm and normal heart sounds.   Pulmonary/Chest: Effort normal and breath sounds normal. No respiratory distress. He has no wheezes. He has no rales. He exhibits no tenderness.  Abdominal: Soft. Bowel sounds are normal. He exhibits no distension and no mass. There is no tenderness. There is no rebound and no guarding.  Musculoskeletal: Normal range of motion. He exhibits tenderness. He exhibits no edema.  FROM all 4 extremities w/o difficulty. Tenderness in left lower lumbar musculature. No midline spinal tenderness, step offs or crepitus. Mild tenderness of left hip. Normal gait.   Neurological: He is alert and oriented to person, place, and time. He  has normal strength. No cranial nerve deficit or sensory deficit. He displays a negative Romberg sign. Coordination and gait normal. GCS eye subscore is 4. GCS verbal subscore is 5. GCS motor subscore is 6.  Skin: Skin is warm and dry.    ED Course  Procedures (including critical care time) Labs Review Labs Reviewed - No data to display Imaging Review No results found.  EKG Interpretation   None       MDM   Final diagnoses:  Left-sided low back pain with left-sided sciatica  HTN (hypertension)  Medication refill    pt with hx of HTN presenting to ED  with lower back pain requesting note to return to work as well as refill on his BP medication, states he has been out for 1 week, likely the cause of pt's elevated BP today- 170/107.  Pt has no new injuries. No red flag symptoms.  Pain appears to be sciatic in nature. Pt may go back to work tomorrow. Work note provided. Medications refilled.     Junius FinnerErin O'Malley, PA-C 04/04/13 0140

## 2013-04-04 NOTE — ED Provider Notes (Signed)
Medical screening examination/treatment/procedure(s) were performed by non-physician practitioner and as supervising physician I was immediately available for consultation/collaboration.  EKG Interpretation   None        Courtney F Horton, MD 04/04/13 0943 

## 2013-11-06 ENCOUNTER — Encounter (HOSPITAL_COMMUNITY): Payer: Self-pay | Admitting: Emergency Medicine

## 2013-11-06 ENCOUNTER — Emergency Department (HOSPITAL_COMMUNITY)
Admission: EM | Admit: 2013-11-06 | Discharge: 2013-11-06 | Disposition: A | Discharge status: Home / Self Care | Payer: Self-pay | Attending: Emergency Medicine | Admitting: Emergency Medicine

## 2013-11-06 DIAGNOSIS — N289 Disorder of kidney and ureter, unspecified: Secondary | ICD-10-CM | POA: Insufficient documentation

## 2013-11-06 DIAGNOSIS — F17200 Nicotine dependence, unspecified, uncomplicated: Secondary | ICD-10-CM | POA: Insufficient documentation

## 2013-11-06 DIAGNOSIS — Z79899 Other long term (current) drug therapy: Secondary | ICD-10-CM | POA: Insufficient documentation

## 2013-11-06 DIAGNOSIS — K029 Dental caries, unspecified: Secondary | ICD-10-CM | POA: Insufficient documentation

## 2013-11-06 DIAGNOSIS — K089 Disorder of teeth and supporting structures, unspecified: Secondary | ICD-10-CM | POA: Insufficient documentation

## 2013-11-06 DIAGNOSIS — I10 Essential (primary) hypertension: Secondary | ICD-10-CM | POA: Insufficient documentation

## 2013-11-06 DIAGNOSIS — Z880 Allergy status to penicillin: Secondary | ICD-10-CM | POA: Insufficient documentation

## 2013-11-06 DIAGNOSIS — K0889 Other specified disorders of teeth and supporting structures: Secondary | ICD-10-CM

## 2013-11-06 LAB — I-STAT CHEM 8, ED
BUN: 18 mg/dL (ref 6–23)
CHLORIDE: 106 meq/L (ref 96–112)
CREATININE: 1.4 mg/dL — AB (ref 0.50–1.35)
Calcium, Ion: 1.16 mmol/L (ref 1.12–1.23)
GLUCOSE: 80 mg/dL (ref 70–99)
HCT: 46 % (ref 39.0–52.0)
Hemoglobin: 15.6 g/dL (ref 13.0–17.0)
POTASSIUM: 4 meq/L (ref 3.7–5.3)
Sodium: 139 mEq/L (ref 137–147)
TCO2: 17 mmol/L (ref 0–100)

## 2013-11-06 MED ORDER — CLINDAMYCIN HCL 300 MG PO CAPS: 300.0000 mg | ORAL_CAPSULE | Freq: Three times a day (TID) | ORAL | Status: DC

## 2013-11-06 MED ORDER — SERTRALINE HCL 50 MG PO TABS: 50.0000 mg | ORAL_TABLET | Freq: Every day | ORAL | Status: DC

## 2013-11-06 MED ORDER — LISINOPRIL-HYDROCHLOROTHIAZIDE 20-25 MG PO TABS: 1.0000 | ORAL_TABLET | Freq: Every day | ORAL | Status: DC

## 2013-11-06 NOTE — Discharge Instructions (Signed)
Return to the ED with any concerns including chest pain, shortness of breath, fever/chills, difficulty swallowing, changes in vision or speech, weakness of arms or legs, decreased level of alertness/lethargy, or any other alarming symptoms

## 2013-11-06 NOTE — ED Provider Notes (Signed)
CSN: [id number]     Arrival date & time 11/06/13  4098 History   First MD Initiated Contact with Patient 11/06/13 [phone]     Chief Complaint  Patient presents with  . Medication Refill  . Hypertension  . Dental Pain     (Consider location/radiation/quality/duration/timing/severity/associated sxs/prior Treatment) HPI Pt presenting with c/o high blood presure, he states he has been off blood pressure meds for the past month.  States he does not have a doctor at this time and has no way to get prescriptions.  C/o mild frontal headache as well.  No chest pain or shortness of breath.  No leg swelling.  No changes in vision or speech.  Pt also c/o throbbing pain in right lower molar- he states approx one week ago a filling came out of that tooth and the pain has been constant and worsening since then.  He has been taking ibuprofen without relief.  No difficulty swallowing.  Pain worse with chewing on that side.  There are no other associated systemic symptoms, there are no other alleviating or modifying factors.   Past Medical History  Diagnosis Date  . Hypertension    History reviewed. No pertinent past surgical history. History reviewed. No pertinent family history. History  Substance Use Topics  . Smoking status: Current Every Day Smoker  . Smokeless tobacco: Not on file  . Alcohol Use: Yes    Review of Systems ROS reviewed and all otherwise negative except for mentioned in HPI    Allergies  Penicillins and Other  Home Medications   Prior to Admission medications   Medication Sig Start Date End Date Taking? Authorizing Provider  ibuprofen (ADVIL,MOTRIN) 200 MG tablet Take 400 mg by mouth every 6 (six) hours as needed for mild pain or moderate pain.   Yes Historical Provider, MD  clindamycin (CLEOCIN) 300 MG capsule Take 1 capsule (300 mg total) by mouth 3 (three) times daily. 11/06/13   Ethelda Chick, MD  lisinopril-hydrochlorothiazide (PRINZIDE,ZESTORETIC) 20-25 MG per tablet  Take 1 tablet by mouth daily. 11/06/13   Ethelda Chick, MD  sertraline (ZOLOFT) 50 MG tablet Take 1 tablet (50 mg total) by mouth daily. 11/06/13   Ethelda Chick, MD   BP 189/104  Pulse 83  Temp(Src) 98.5 F (36.9 C) (Oral)  Resp 10  SpO2 92% Vitals reviewed Physical Exam Physical Examination: General appearance - alert, well appearing, and in no distress Mental status - alert, oriented to person, place, and time Eyes - no conjunctival injection, no scleral icterus Mouth - mucous membranes moist, pharynx normal without lesions, decay noted to right lower posterior molar, ttp over that tooth, no evidence of gingival abscess, no facial swelling to suggest periapical abscess Neck - supple, no significant adenopathy Chest - clear to auscultation, no wheezes, rales or rhonchi, symmetric air entry Heart - normal rate, regular rhythm, normal S1, S2, no murmurs, rubs, clicks or gallops Abdomen - soft, nontender, nondistended, no masses or organomegaly Extremities - peripheral pulses normal, no pedal edema, no clubbing or cyanosis Skin - normal coloration and turgor, no rashes  ED Course  Procedures (including critical care time)   Date: 11/06/2013  Rate: 87  Rhythm: normal sinus rhythm  QRS Axis: normal  Intervals: normal  ST/T Wave abnormalities: early repolarization  Conduction Disutrbances:borderline prolonged QT interval  Narrative Interpretation:   Old EKG Reviewed: none available EKG not crossing over into epic for interpration in muse Labs Review Labs Reviewed  I-STAT CHEM 8, ED -  Abnormal; Notable for the following:    Creatinine, Ser 1.40 (*)    All other components within normal limits    Imaging Review No results found.   EKG Interpretation None      MDM   Final diagnoses:  Essential hypertension  Renal insufficiency  Pain, dental    Pt presenting with c/o hypertension, dental pain. He has a normal neuro exam, EKG reassuring.  No signs of pulmonary edema  or chest pain.  He does have some mild renal insufficiency.  He was given prescriptions for his meds, counseled about the importance of having blood work rechecked, taking BP meds regularly and given information for PMD followup.  Pt also started on clindamycin for dental pain (PCN allergic) and given information for dental followup.  Discharged with strict return precautions.  Pt agreeable with plan.    Ethelda ChickMartha K Linker, MD 11/06/13 1356

## 2013-11-06 NOTE — ED Notes (Addendum)
43 yo male presents with c/o hypertension due to lack of BP meds for 2 months. Also c/o dental pain in the lower left side of mouth due to filling falling out. Pt denies Chest pain, n/v/d, swelling. Reports having a headache due to elevated BP. Pt is alert.oreinted x3. NAD. Pt also reports last use of cocaine a few days ago due to the lack of zoloft and meds

## 2014-02-22 ENCOUNTER — Encounter (HOSPITAL_COMMUNITY): Payer: Self-pay | Admitting: Emergency Medicine

## 2014-02-22 ENCOUNTER — Emergency Department (HOSPITAL_COMMUNITY)
Admission: EM | Admit: 2014-02-22 | Discharge: 2014-02-22 | Disposition: A | Discharge status: Home / Self Care | Payer: Self-pay | Attending: Emergency Medicine | Admitting: Emergency Medicine

## 2014-02-22 DIAGNOSIS — Z880 Allergy status to penicillin: Secondary | ICD-10-CM | POA: Insufficient documentation

## 2014-02-22 DIAGNOSIS — Z720 Tobacco use: Secondary | ICD-10-CM | POA: Insufficient documentation

## 2014-02-22 DIAGNOSIS — I10 Essential (primary) hypertension: Secondary | ICD-10-CM | POA: Insufficient documentation

## 2014-02-22 LAB — CBC WITH DIFFERENTIAL/PLATELET
BASOS PCT: 0 % (ref 0–1)
Basophils Absolute: 0 10*3/uL (ref 0.0–0.1)
Eosinophils Absolute: 0 10*3/uL (ref 0.0–0.7)
Eosinophils Relative: 0 % (ref 0–5)
HCT: 41.1 % (ref 39.0–52.0)
Hemoglobin: 14.9 g/dL (ref 13.0–17.0)
LYMPHS PCT: 18 % (ref 12–46)
Lymphs Abs: 2.6 10*3/uL (ref 0.7–4.0)
MCH: 30 pg (ref 26.0–34.0)
MCHC: 36.3 g/dL — ABNORMAL HIGH (ref 30.0–36.0)
MCV: 82.7 fL (ref 78.0–100.0)
Monocytes Absolute: 0.8 10*3/uL (ref 0.1–1.0)
Monocytes Relative: 6 % (ref 3–12)
NEUTROS PCT: 76 % (ref 43–77)
Neutro Abs: 11 10*3/uL — ABNORMAL HIGH (ref 1.7–7.7)
Platelets: 201 10*3/uL (ref 150–400)
RBC: 4.97 MIL/uL (ref 4.22–5.81)
RDW: 13.2 % (ref 11.5–15.5)
WBC: 14.4 10*3/uL — ABNORMAL HIGH (ref 4.0–10.5)

## 2014-02-22 LAB — COMPREHENSIVE METABOLIC PANEL
ALBUMIN: 4 g/dL (ref 3.5–5.2)
ALT: 18 U/L (ref 0–53)
AST: 22 U/L (ref 0–37)
Alkaline Phosphatase: 70 U/L (ref 39–117)
Anion gap: 12 (ref 5–15)
BILIRUBIN TOTAL: 0.4 mg/dL (ref 0.3–1.2)
BUN: 9 mg/dL (ref 6–23)
CHLORIDE: 100 meq/L (ref 96–112)
CO2: 22 mmol/L (ref 19–32)
Calcium: 9.2 mg/dL (ref 8.4–10.5)
Creatinine, Ser: 1.29 mg/dL (ref 0.50–1.35)
GFR calc Af Amer: 76 mL/min — ABNORMAL LOW (ref >90)
GFR calc non Af Amer: 66 mL/min — ABNORMAL LOW (ref >90)
GLUCOSE: 91 mg/dL (ref 70–99)
POTASSIUM: 3.8 mmol/L (ref 3.5–5.1)
SODIUM: 134 mmol/L — AB (ref 135–145)
Total Protein: 7.7 g/dL (ref 6.0–8.3)

## 2014-02-22 MED ORDER — HYDROCHLOROTHIAZIDE 25 MG PO TABS
25.0000 mg | ORAL_TABLET | Freq: Every day | ORAL | Status: DC
  Filled 2014-02-22: qty 1

## 2014-02-22 MED ORDER — LISINOPRIL 20 MG PO TABS
20.0000 mg | ORAL_TABLET | Freq: Once | ORAL | Status: AC
  Administered 2014-02-22: 20 mg via ORAL
  Filled 2014-02-22: qty 1

## 2014-02-22 MED ORDER — LISINOPRIL-HYDROCHLOROTHIAZIDE 20-25 MG PO TABS: 1.0000 | ORAL_TABLET | Freq: Every day | ORAL | Status: DC

## 2014-02-22 MED ORDER — SERTRALINE HCL 50 MG PO TABS: 50.0000 mg | ORAL_TABLET | Freq: Every day | ORAL | Status: DC

## 2014-02-22 NOTE — ED Notes (Addendum)
The patient said he has been "stressed out" not taking his medications.  He is taking BP medication and an antidepressant and has not taking either in months.  He said he came in because he started having a headache so he wanted to come in and get treated.  He denies any other symptoms other than a headache.   He rates his headache 5/10.  The patient did admit to smoking and snorting cocaine.

## 2014-02-22 NOTE — ED Notes (Signed)
PA at bedside per pt request.

## 2014-02-22 NOTE — Discharge Instructions (Signed)
How to Take Your Blood Pressure HOW DO I GET A BLOOD PRESSURE MACHINE?  You can buy an electronic home blood pressure machine at your local pharmacy. Insurance will sometimes cover the cost if you have a prescription.  Ask your doctor what type of machine is best for you. There are different machines for your arm and your wrist.  If you decide to buy a machine to check your blood pressure on your arm, first check the size of your arm so you can buy the right size cuff. To check the size of your arm:   Use a measuring tape that shows both inches and centimeters.   Wrap the measuring tape around the upper-middle part of your arm. You may need someone to help you measure.   Write down your arm measurement in both inches and centimeters.   To measure your blood pressure correctly, it is important to have the right size cuff.   If your arm is up to 13 inches (up to 34 centimeters), get an adult cuff size.  If your arm is 13 to 17 inches (35 to 44 centimeters), get a large adult cuff size.    If your arm is 17 to 20 inches (45 to 52 centimeters), get an adult thigh cuff.  WHAT DO THE NUMBERS MEAN?   There are two numbers that make up your blood pressure. For example: 120/80.  The first number (120 in our example) is called the "systolic pressure." It is a measure of the pressure in your blood vessels when your heart is pumping blood.  The second number (80 in our example) is called the "diastolic pressure." It is a measure of the pressure in your blood vessels when your heart is resting between beats.  Your doctor will tell you what your blood pressure should be. WHAT SHOULD I DO BEFORE I CHECK MY BLOOD PRESSURE?   Try to rest or relax for at least 30 minutes before you check your blood pressure.  Do not smoke.  Do not have any drinks with caffeine, such as:  Soda.  Coffee.  Tea.  Check your blood pressure in a quiet room.  Sit down and stretch out your arm on a table.  Keep your arm at about the level of your heart. Let your arm relax.  Make sure that your legs are not crossed. HOW DO I CHECK MY BLOOD PRESSURE?  Follow the directions that came with your machine.  Make sure you remove any tight-fighting clothing from your arm or wrist. Wrap the cuff around your upper arm or wrist. You should be able to fit a finger between the cuff and your arm. If you cannot fit a finger between the cuff and your arm, it is too tight and should be removed and rewrapped.  Some units require you to manually pump up the arm cuff.  Automatic units inflate the cuff when you press a button.  Cuff deflation is automatic in both models.  After the cuff is inflated, the unit measures your blood pressure and pulse. The readings are shown on a monitor. Hold still and breathe normally while the cuff is inflated.  Getting a reading takes less than a minute.  Some models store readings in a memory. Some provide a printout of readings. If your machine does not store your readings, keep a written record.  Take readings with you to your next visit with your doctor. Document Released: 01/08/2008 Document Revised: 06/11/2013 Document Reviewed: 03/22/2013 ExitCare Patient Information  2015 ExitCare, LLC. This information is not intended to replace advice given to you by your health care provider. Make sure you discuss any questions you have with your health care provider. ° °Hypertension °Hypertension, commonly called high blood pressure, is when the force of blood pumping through your arteries is too strong. Your arteries are the blood vessels that carry blood from your heart throughout your body. A blood pressure reading consists of a higher number over a lower number, such as 110/72. The higher number (systolic) is the pressure inside your arteries when your heart pumps. The lower number (diastolic) is the pressure inside your arteries when your heart relaxes. Ideally you want your blood  pressure below 120/80. °Hypertension forces your heart to work harder to pump blood. Your arteries may become narrow or stiff. Having hypertension puts you at risk for heart disease, stroke, and other problems.  °RISK FACTORS °Some risk factors for high blood pressure are controllable. Others are not.  °Risk factors you cannot control include:  °· Race. You may be at higher risk if you are African American. °· Age. Risk increases with age. °· Gender. Men are at higher risk than women before age 45 years. After age 65, women are at higher risk than men. °Risk factors you can control include: °· Not getting enough exercise or physical activity. °· Being overweight. °· Getting too much fat, sugar, calories, or salt in your diet. °· Drinking too much alcohol. °SIGNS AND SYMPTOMS °Hypertension does not usually cause signs or symptoms. Extremely high blood pressure (hypertensive crisis) may cause headache, anxiety, shortness of breath, and nosebleed. °DIAGNOSIS  °To check if you have hypertension, your health care provider will measure your blood pressure while you are seated, with your arm held at the level of your heart. It should be measured at least twice using the same arm. Certain conditions can cause a difference in blood pressure between your right and left arms. A blood pressure reading that is higher than normal on one occasion does not mean that you need treatment. If one blood pressure reading is high, ask your health care provider about having it checked again. °TREATMENT  °Treating high blood pressure includes making lifestyle changes and possibly taking medicine. Living a healthy lifestyle can help lower high blood pressure. You may need to change some of your habits. °Lifestyle changes may include: °· Following the DASH diet. This diet is high in fruits, vegetables, and whole grains. It is low in salt, red meat, and added sugars. °· Getting at least 2½ hours of brisk physical activity every week. °· Losing  weight if necessary. °· Not smoking. °· Limiting alcoholic beverages. °· Learning ways to reduce stress. ° If lifestyle changes are not enough to get your blood pressure under control, your health care provider may prescribe medicine. You may need to take more than one. Work closely with your health care provider to understand the risks and benefits. °HOME CARE INSTRUCTIONS °· Have your blood pressure rechecked as directed by your health care provider.   °· Take medicines only as directed by your health care provider. Follow the directions carefully. Blood pressure medicines must be taken as prescribed. The medicine does not work as well when you skip doses. Skipping doses also puts you at risk for problems.   °· Do not smoke.   °· Monitor your blood pressure at home as directed by your health care provider.  °SEEK MEDICAL CARE IF:  °· You think you are having a reaction to medicines taken. °·   You have recurrent headaches or feel dizzy.  You have swelling in your ankles.  You have trouble with your vision. SEEK IMMEDIATE MEDICAL CARE IF:  You develop a severe headache or confusion.  You have unusual weakness, numbness, or feel faint.  You have severe chest or abdominal pain.  You vomit repeatedly.  You have trouble breathing. MAKE SURE YOU:   Understand these instructions.  Will watch your condition.  Will get help right away if you are not doing well or get worse. Document Released: 01/25/2005 Document Revised: 06/11/2013 Document Reviewed: 11/17/2012 [facility] Patient Information 2015 [employer], [place]. This information is not intended to replace advice given to you by your health care provider. Make sure you discuss any questions you have with your health care provider.  Emergency Department Resource Guide 1) Find a Doctor and Pay Out of Pocket Although you won't have to find out who is covered by your insurance plan, it is a good idea to ask around and get recommendations. You will then  need to call the office and see if the doctor you have chosen will accept you as a new patient and what types of options they offer for patients who are self-pay. Some doctors offer discounts or will set up payment plans for their patients who do not have insurance, but you will need to ask so you aren't surprised when you get to your appointment.  2) Contact Your Local Health Department Not all health departments have doctors that can see patients for sick visits, but many do, so it is worth a call to see if yours does. If you don't know where your local health department is, you can check in your phone book. The CDC also has a tool to help you locate your state's health department, and many state websites also have listings of all of their local health departments.  3) Find a Walk-in Clinic If your illness is not likely to be very severe or complicated, you may want to try a walk in clinic. These are popping up all over the country in pharmacies, drugstores, and shopping centers. They're usually staffed by nurse practitioners or physician assistants that have been trained to treat common illnesses and complaints. They're usually fairly quick and inexpensive. However, if you have serious medical issues or chronic medical problems, these are probably not your best option.  No Primary Care Doctor: - Call Health Connect at  [phone] - they can help you locate a primary care doctor that  accepts your insurance, provides certain services, etc. - Physician Referral Service- [phone]  Chronic Pain Problems: Organization         Address  Phone   Notes  [facility]  [phone] Patients need to be referred by their primary care doctor.   Medication Assistance: Organization         Address  Phone   Notes  [facility] [address] [place], [place] [id number] [phone] --Must be a resident of [facility] -- Must have NO  insurance coverage whatsoever (no Medicaid/ Medicare, etc.) -- The pt. MUST have a primary care doctor that directs their care regularly and follows them in the community   Med[facility]ist  [phone]   [employer]  [phone]    Agencies that provide inexpensive medical care: Organization         Address  Phone   Notes  [facility]  [phone]  [facility]    [phone]   [facility] [address] [place], [place] [address] [phone]   [facility] [address] [place] [place] [phone]   Planned Parenthood    [phone]   [facility]    [phone]   [facility] and [facility]  [address], [place] Phone:  [phone], Fax:  [phone] Hours of Operation:  9 am - 6 pm, M-F.  Also accepts Medicaid/Medicare and self-pay.  [facility]  [address], [place] Phone: [phone], Fax: [phone]. Hours of Operation:  8:30 am - 5:30 pm, M-F.  Also accepts Medicaid and self-pay.  [facility] [place] [address] [place] Phone: [phone]   [facility] [address] Natasha Bence [place], [place] [phone], Ext. [phone] Mondays & Thursdays: 7-9 AM.  First 15 patients are seen on a first come, first serve basis.    Medicaid-accepting [facility] Providers:  Organization         Address  Phone   Notes  [facility] [address], [place] [phone] Also accepts self-pay patients.  [facility] [address] Laurell Josephs [place], [place]  [phone]   [facility] [address], [place] [phone]   [facility] [address], [place] [phone]   Renaye Rakers [address], [place]   [phone] Only accepts [place] patients after they have  their name applied to their card.   Self-Pay (no insurance) in [facility]:  Organization         Address  Phone   Notes  Sickle Cell Patients, [facility] [address] [place], [place] [phone]   [facility] [address] [place], [place] [phone]   [facility] [place]  [address], [place] [phone]   [facility]/Dr. Osei-Bonsu  [address], [place] or [address], [place] [phone] Phone number for both [place] and [place] locations is the same.  [facility] [address] [place] [phone]   [facility] [address], [place] or [address] [phone]   [facility] [address] [place] [phone], phone; [phone], fax Sees patients 1st and 3rd Saturday of every month.  Must not qualify for public or private insurance (i.e. Medicaid, Medicare, [place] Health Choice, Veterans' Benefits)  Household income should be no more than 200% of the poverty level The clinic cannot treat you if you are pregnant or think you are pregnant  Sexually transmitted diseases are not treated at the clinic.    Dental Care: Organization         Address  Phone  Notes  [facility] Department of [facility] [address] [place], [place] [phone] Accepts children up to age 46 who are enrolled in [place] or [place] Health Choice; pregnant women with a Medicaid card; and children who have applied for Medicaid or [place] Health Choice, but were declined, whose parents can pay a reduced fee at time of service.  [facility] Department of [facility]  [address], [employer]  [place] [phone] Accepts children up to age 90 who are enrolled in Medicaid or [place] Health Choice; pregnant women with a Medicaid card; and children who have applied  for Medicaid or [place] Health Choice, but were declined, whose parents can pay a reduced fee at time of service.  [facility]  [address] [place], [place] [phone] Patients are seen by appointment only. Walk-ins are not accepted. [facility] will see patients 43 years of age and older. Monday - Tuesday (8am-5pm) Most Wednesdays (8:30-5pm) $30 per visit, cash only  [facility] Adult Dental Access PROGRAM  [address], [facility] [phone] Patients are seen by appointment only. Walk-ins are not accepted. [facility] will see patients 59 years of age and older. One Wednesday Evening (Monthly: Volunteer Based).  $30 per visit, cash only  [employer]  [phone] for adults; Children under age 18, call Graduate Pediatric Dentistry at [phone]. Children aged 91-14, please call [phone] to request a pediatric application.  Dental services are provided in all areas of dental care including fillings, crowns and bridges, complete and partial dentures, implants, gum treatment, root canals, and extractions. Preventive care is also provided. Treatment is provided to both adults and children. Patients are selected via a lottery and there is often a waiting list.   [facility] [address], [place]  [phone] [email or web address]   [facility] [address] [place], [place] [phone], Ext. [phone] Second and Fourth Thursday of each month, opens at 6:30 AM; Clinic ends at 9 AM.  Patients are seen on a first-come first-served basis, and a limited number are seen during each clinic.   [facility]  [address] Ether Griffins [place], [place] [phone]   Eligibility Requirements You must have lived in [place], [place], or [place] counties for at least the last three months.   You cannot be eligible for state or federal sponsored [place], including [employer],  [place], or [employer].   You generally cannot be eligible for healthcare insurance through your employer.    How to apply: Eligibility screenings are held every Tuesday and Wednesday afternoon from 1:00 pm until 4:00 pm. You do not need an appointment for the interview!  [facility] [address], [place], [place] [phone]   [facility] Health Department  [phone]   [facility] Health Department  [phone]   [facility]  [phone]    Behavioral Health Resources in the Community: Intensive Outpatient Programs Organization         Address  Phone  Notes  [facility] [address], [place], [place] [phone]   [facility] [address] [place], [place] [phone]   [facility] [address], [place], [place]  [phone]   [facility] Mental Health [address]  [place], [place] [phone] or [phone]   Substance Abuse Resources Organization         Address  Phone  Notes  [facility]  [phone]   [facility]  [phone]   [facility] [place]  [phone]   Floydene Flock  [phone]   Residential & Outpatient Substance Abuse Program  [phone]   Psychological Services Organization         Address  Phone  Notes  [facility] Health  [phone]   [facility]  [phone]  [facility] [address] [place] [phone] or [phone]    Mobile Crisis Teams Organization         Address  Phone  Notes  [facility], Mobile Crisis Care Unit  [phone]   [facility]  [address] [place], [place] [phone]   Doristine Locks [address] [place] [phone]    Self-Help/Support Groups Organization         Address  Phone             Notes  Mental Health Assoc. of  - variety of support  groups  336- I7437963 Call for more information  Narcotics Anonymous (NA), [facility] [address], [employer] Marissa  2 meetings at this location   Statistician         Address  Phone  Notes  [facility] [address],    [place]  [phone]   [facility]  [address] [place] [address], [place], [place] [phone]   [facility] [address] [place], [place] [phone] Admissions: 8am-3pm M-F  [facility] [address]    [place], [place] [phone]   [facility] [address] [place], [place], [place] [phone]   [facility] [place] [facility] [address],  [place], [place] [phone]   [facility] - Intensive Outpatient [address] Laurell Josephs [address], [place], [place] [phone]   [facility] ([facility] [address] [place]  [place], [place] [phone] or [phone]   [facility] ([facility]) [address] [place], [place] [phone] Accepts Medicaid  [facility] [address]  [place] [phone] Substance Abuse/Addiction Treatment   [facility] Organization         Address  Phone  Notes  [facility]  [phone]   Angie Fava, PhD [address] Ervin Knack [place], [place]   [phone] or [phone]   [facility] Behavioral   [address] [place], [place] [phone]   [facility] [address] [place], [place] [phone] Insurance/Medicaid/sponsorship through [facility] and Families [address]                                    [place], [place] [phone] Therapy/tele-psych/case  [facility] [address], [place] [phone]    Dr. Lolly Mustache  [phone]   [facility] [place]  [employer] [facility] Dept. 1) [address], [place] 2) [address], [place] 3)   [address], [place] [phone]  [phone]   [facility] Child Abuse Hotline [phone] or [phone] (After Hours)

## 2014-02-22 NOTE — ED Provider Notes (Signed)
CSN: [id number]     Arrival date & time 02/22/14  0257 History   First MD Initiated Contact with Patient 02/22/14 [phone]     Chief Complaint  Patient presents with  . Hypertension    The patient said he has been "stressed out" not taking his medications.  He is taking BP medication and an antidepressant and has not taking either in months.     (Consider location/radiation/quality/duration/timing/severity/associated sxs/prior Treatment) Patient is a 44 y.o. male presenting with hypertension. The history is provided by the patient. No language interpreter was used.  Hypertension This is a new problem. The current episode started more than 1 month ago. The problem occurs constantly. The problem has been gradually worsening. Pertinent negatives include no chest pain, headaches or nausea. Nothing aggravates the symptoms. He has tried nothing for the symptoms. The treatment provided moderate relief.    Past Medical History  Diagnosis Date  . Hypertension    History reviewed. No pertinent past surgical history. History reviewed. No pertinent family history. History  Substance Use Topics  . Smoking status: Current Every Day Smoker  . Smokeless tobacco: Not on file  . Alcohol Use: Yes    Review of Systems  Cardiovascular: Negative for chest pain.  Gastrointestinal: Negative for nausea.  Neurological: Negative for headaches.  All other systems reviewed and are negative.     Allergies  Penicillins; Citric acid; and Other  Home Medications   Prior to Admission medications   Medication Sig Start Date End Date Taking? Authorizing Provider  clindamycin (CLEOCIN) 300 MG capsule Take 1 capsule (300 mg total) by mouth 3 (three) times daily. Patient not taking: Reported on 02/22/2014 11/06/13   Ethelda ChickMartha K Linker, MD  lisinopril-hydrochlorothiazide (PRINZIDE,ZESTORETIC) 20-25 MG per tablet Take 1 tablet by mouth daily. Patient not taking: Reported on 02/22/2014 11/06/13   Ethelda ChickMartha K Linker, MD   sertraline (ZOLOFT) 50 MG tablet Take 1 tablet (50 mg total) by mouth daily. Patient not taking: Reported on 02/22/2014 11/06/13   Ethelda ChickMartha K Linker, MD   BP 181/119 mmHg  Pulse 88  Temp(Src) 98.9 F (37.2 C) (Oral)  Resp 18  Ht 6\' 1"  (1.854 m)  Wt 240 lb (108.863 kg)  BMI 31.67 kg/m2  SpO2 100% Physical Exam  Constitutional: He is oriented to person, place, and time. He appears well-developed and well-nourished.  HENT:  Head: Normocephalic and atraumatic.  Right Ear: External ear normal.  Left Ear: External ear normal.  Eyes: Conjunctivae and EOM are normal. Pupils are equal, round, and reactive to light.  Neck: Normal range of motion.  Cardiovascular: Normal rate and normal heart sounds.   Pulmonary/Chest: Effort normal.  Abdominal: He exhibits no distension.  Musculoskeletal: Normal range of motion.  Neurological: He is alert and oriented to person, place, and time.  Psychiatric: He has a normal mood and affect.  Nursing note and vitals reviewed.   ED Course  Procedures (including critical care time) Labs Review Labs Reviewed  CBC WITH DIFFERENTIAL - Abnormal; Notable for the following:    WBC 14.4 (*)    MCHC 36.3 (*)    Neutro Abs 11.0 (*)    All other components within normal limits  COMPREHENSIVE METABOLIC PANEL - Abnormal; Notable for the following:    Sodium 134 (*)    GFR calc non Af Amer 66 (*)    GFR calc Af Amer 76 (*)    All other components within normal limits    Imaging Review No results found.  EKG Interpretation None      MDM  Pt is out of blood pressure medications. Pt reports he has had a headache and is concerned it is from his blood pressure.  Pt also has a history of alcohol and cocaine use.    I counseled pt.  I will give rx for bp medication.  He is given a Facilities manager and advised to follow up.   He is advised of risk of stroke from elevated bp and risk of stroke/heart attack from cocaine use.    Final diagnoses:  Essential  hypertension    Lisinopril/hctz AVS    Elson Areas, PA-C 02/22/14 1610  Ward Givens, MD 02/22/14 [phone]

## 2014-02-22 NOTE — ED Notes (Signed)
Karen, PA at the bedside.  

## 2014-09-02 ENCOUNTER — Encounter (HOSPITAL_COMMUNITY): Payer: Self-pay | Admitting: Emergency Medicine

## 2014-09-02 ENCOUNTER — Emergency Department (HOSPITAL_COMMUNITY)
Admission: EM | Admit: 2014-09-02 | Discharge: 2014-09-02 | Disposition: A | Discharge status: Home / Self Care | Payer: Self-pay | Attending: Emergency Medicine | Admitting: Emergency Medicine

## 2014-09-02 DIAGNOSIS — I10 Essential (primary) hypertension: Secondary | ICD-10-CM | POA: Insufficient documentation

## 2014-09-02 DIAGNOSIS — Z720 Tobacco use: Secondary | ICD-10-CM | POA: Insufficient documentation

## 2014-09-02 DIAGNOSIS — Z880 Allergy status to penicillin: Secondary | ICD-10-CM | POA: Insufficient documentation

## 2014-09-02 DIAGNOSIS — Z79899 Other long term (current) drug therapy: Secondary | ICD-10-CM | POA: Insufficient documentation

## 2014-09-02 DIAGNOSIS — Z760 Encounter for issue of repeat prescription: Secondary | ICD-10-CM | POA: Insufficient documentation

## 2014-09-02 MED ORDER — SERTRALINE HCL 50 MG PO TABS: 50.0000 mg | ORAL_TABLET | Freq: Every day | ORAL | Status: DC

## 2014-09-02 MED ORDER — SERTRALINE HCL 50 MG PO TABS
25.0000 mg | ORAL_TABLET | Freq: Every day | ORAL | Status: DC
  Administered 2014-09-02: 25 mg via ORAL
  Filled 2014-09-02: qty 1

## 2014-09-02 NOTE — Discharge Instructions (Signed)
Medication Refill, Emergency Department I have refilled your medication today but this is not normally done in the ED. Follow-up with [facility] but you must go around 8 AM in order to be seen. We have refilled your medication today as a courtesy to you. It is best for your medical care, however, to take care of getting refills done through your primary caregiver's office. They have your records and can do a better job of follow-up than we can in the emergency department. On maintenance medications, we often only prescribe enough medications to get you by until you are able to see your regular caregiver. This is a more expensive way to refill medications. In the future, please plan for refills so that you will not have to use the emergency department for this. Thank you for your help. Your help allows [place] to better take care of the daily emergencies that enter our department. Document Released: 05/14/2003 Document Revised: 04/19/2011 Document Reviewed: 05/04/2013 [facility] Patient Information 2015 [place], [place]. This information is not intended to replace advice given to you by your health care provider. Make sure you discuss any questions you have with your health care provider.

## 2014-09-02 NOTE — ED Notes (Signed)
The patient is out of his Zoloft prescription.  He needs a refill for it until he can find a PCP.  He is starting a job full time and should be able to get a prescription when he finds a PCP.  The patient has no complaints.

## 2014-09-02 NOTE — ED Provider Notes (Signed)
CSN: [id number]     Arrival date & time 09/02/14  1606 History   This chart was scribed for Catha Gosselin, PA-C working with Gerhard Munch, MD by Evon Slack, ED Scribe. This patient was seen in room TR06C/TR06C and the patient's care was started at 4:41 PM.      Chief Complaint  Patient presents with  . Medication Refill    The patient is out of his Zoloft prescription.  He needs a refill for it until he can find a PCP.  He is starting a job full time and should be able to get a prescription when he finds a PCP.   The history is provided by the patient. No language interpreter was used.   HPI Comments: Joshua Daugherty is a 44 y.o. male who presents to the Emergency Department for medication refill. Pt states that he has recently ran out of his 50 mg Zoloft. Pt states that he has a Hx of depression and anxiety. Pt states that he doesn't have a PCP due to not having insurance. Pt states that he has tried to receive care from [facility] but wasn't successful due to having to be at work. He states he needs those medications because his brother passed away 3 years ago and he has a difficult time dealing with that and functioning without the medication. Pt doesn't report any other symptoms.   Past Medical History  Diagnosis Date  . Hypertension    History reviewed. No pertinent past surgical history. History reviewed. No pertinent family history. History  Substance Use Topics  . Smoking status: Current Every Day Smoker  . Smokeless tobacco: Not on file  . Alcohol Use: Yes    Review of Systems  Constitutional: Negative for fever.  Respiratory: Negative for shortness of breath.   Cardiovascular: Negative for chest pain.  Neurological: Negative for dizziness.      Allergies  Penicillins; Citric acid; and Other  Home Medications   Prior to Admission medications   Medication Sig Start Date End Date Taking? Authorizing Provider  lisinopril-hydrochlorothiazide  (PRINZIDE,ZESTORETIC) 20-25 MG per tablet Take 1 tablet by mouth daily. 02/22/14   Elson Areas, PA-C  sertraline (ZOLOFT) 50 MG tablet Take 1 tablet (50 mg total) by mouth daily. 09/02/14   Xian Alves Patel-Mills, PA-C   BP 172/112 mmHg  Pulse 79  Temp(Src) 99.1 F (37.3 C) (Oral)  Resp 16  SpO2 99%   Physical Exam  Constitutional: He is oriented to person, place, and time. He appears well-developed and well-nourished. No distress.  HENT:  Head: Normocephalic and atraumatic.  Eyes: Conjunctivae and EOM are normal.  Neck: Neck supple. No tracheal deviation present.  Cardiovascular: Normal rate.   Pulmonary/Chest: Effort normal. No respiratory distress.  Musculoskeletal: Normal range of motion.  Neurological: He is alert and oriented to person, place, and time.  Skin: Skin is warm and dry.  Psychiatric: He has a normal mood and affect. His behavior is normal.  Nursing note and vitals reviewed.   ED Course  Procedures (including critical care time) DIAGNOSTIC STUDIES: Oxygen Saturation is 99% on RA, normal by my interpretation.    COORDINATION OF CARE: 4:49 PM-Discussed treatment plan with pt at bedside and pt agreed to plan.     Labs Review Labs Reviewed - No data to display  Imaging Review No results found.   EKG Interpretation None      MDM   Final diagnoses:  Medication refill  Patient presents for refill of Zoloft 50 mg.  Pt's last dose of Zoloft was 6 months prior which was prescribed in the ED.He is well appearing and in no acute distress. I discussed that I would only give him 20 pills and that he would need follow-up at [facility] at 8 AM. I had case management see the patient due to him not being able to get time off of work to get to [employer]. Wanda set up an appointment for him at [facility] and wellness during a time that he is not at work this week. Patient verbally agrees with the plan.   He is hypertensive but but has been noncompliant with blood pressure  medication although he states he has the refill at home. I personally performed the services described in this documentation, which was scribed in my presence. The recorded information has been reviewed and is accurate.      Catha Gosselin, PA-C 09/02/14 1741  Gerhard Munch, MD 09/02/14 [phone]

## 2014-09-02 NOTE — ED Notes (Signed)
Pt A&Ox4, ambulatory at d/c with steady gait, NAD 

## 2014-09-12 ENCOUNTER — Encounter: Payer: Self-pay | Admitting: Family Medicine

## 2014-09-12 ENCOUNTER — Ambulatory Visit: Payer: Self-pay | Attending: Family Medicine | Admitting: Family Medicine

## 2014-09-12 VITALS — BP 164/112 | HR 85 | Temp 99.2°F | Resp 18 | Ht 73.0 in | Wt 228.0 lb

## 2014-09-12 DIAGNOSIS — Z114 Encounter for screening for human immunodeficiency virus [HIV]: Secondary | ICD-10-CM | POA: Insufficient documentation

## 2014-09-12 DIAGNOSIS — R6882 Decreased libido: Secondary | ICD-10-CM | POA: Insufficient documentation

## 2014-09-12 DIAGNOSIS — F32A Depression, unspecified: Secondary | ICD-10-CM

## 2014-09-12 DIAGNOSIS — F17200 Nicotine dependence, unspecified, uncomplicated: Secondary | ICD-10-CM | POA: Insufficient documentation

## 2014-09-12 DIAGNOSIS — I10 Essential (primary) hypertension: Secondary | ICD-10-CM | POA: Insufficient documentation

## 2014-09-12 DIAGNOSIS — F329 Major depressive disorder, single episode, unspecified: Secondary | ICD-10-CM | POA: Insufficient documentation

## 2014-09-12 MED ORDER — AMLODIPINE BESYLATE 5 MG PO TABS
5.0000 mg | ORAL_TABLET | Freq: Every evening | ORAL | Status: DC
Start: 2014-09-12 — End: 2015-07-03

## 2014-09-12 MED ORDER — SERTRALINE HCL 50 MG PO TABS: 50.0000 mg | ORAL_TABLET | Freq: Every day | ORAL | Status: DC

## 2014-09-12 MED ORDER — LISINOPRIL-HYDROCHLOROTHIAZIDE 20-12.5 MG PO TABS: 2.0000 | ORAL_TABLET | Freq: Every day | ORAL | Status: DC

## 2014-09-12 NOTE — Progress Notes (Signed)
Establish Care F/U HTN 

## 2014-09-12 NOTE — Assessment & Plan Note (Signed)
Screening HIV ordered  

## 2014-09-12 NOTE — Patient Instructions (Addendum)
Joshua Daugherty,  Thank you for coming in today. It was a pleasure meeting you. I look forward to being your primary doctor.  1. HTN: BP goal is < 140/90 Continue prinzide, increase to 20-12.5, two tabs once daily in morning Add novasc 5 mg in evening  With plan to increase to 10 mg in evening if BP above goal  Low salt diet recommended   2. Depression: Refilled zoloft Counseling services are also available on site and at [facility] [place] [facility] [place]   3. Checking testosterone level today due to your concern about your sex drive.   F/u in 3-4 weeks with RN for BP check F/u with me in 3 months  Dr. Armen Pickup   DASH Eating Plan DASH stands for "Dietary Approaches to Stop Hypertension." The DASH eating plan is a healthy eating plan that has been shown to reduce high blood pressure (hypertension). Additional health benefits may include reducing the risk of type 2 diabetes mellitus, heart disease, and stroke. The DASH eating plan may also help with weight loss. WHAT DO I NEED TO KNOW ABOUT THE DASH EATING PLAN? For the DASH eating plan, you will follow these general guidelines:  Choose foods with a percent daily value for sodium of less than 5% (as listed on the food label).  Use salt-free seasonings or herbs instead of table salt or sea salt.  Check with your health care provider or pharmacist before using salt substitutes.  Eat lower-sodium products, often labeled as "lower sodium" or "no salt added."  Eat fresh foods.  Eat more vegetables, fruits, and low-fat dairy products.  Choose whole grains. Look for the word "whole" as the first word in the ingredient list.  Choose fish and skinless chicken or [place] more often than red meat. Limit fish, poultry, and meat to 6 oz (170 g) each day.  Limit sweets, desserts, sugars, and sugary drinks.  Choose heart-healthy fats.  Limit cheese to 1 oz (28 g) per day.  Eat more home-cooked food and less restaurant, buffet, and  fast food.  Limit fried foods.  Cook foods using methods other than frying.  Limit canned vegetables. If you do use them, rinse them well to decrease the sodium.  When eating at a restaurant, ask that your food be prepared with less salt, or no salt if possible. WHAT FOODS CAN I EAT? Seek help from a dietitian for individual calorie needs. Grains Whole grain or whole wheat bread. Brown rice. Whole grain or whole wheat pasta. Quinoa, bulgur, and whole grain cereals. Low-sodium cereals. Corn or whole wheat flour tortillas. Whole grain cornbread. Whole grain crackers. Low-sodium crackers. Vegetables Fresh or frozen vegetables (raw, steamed, roasted, or grilled). Low-sodium or reduced-sodium tomato and vegetable juices. Low-sodium or reduced-sodium tomato sauce and paste. Low-sodium or reduced-sodium canned vegetables.  Fruits All fresh, canned (in natural juice), or frozen fruits. Meat and Other Protein Products Ground beef (85% or leaner), grass-fed beef, or beef trimmed of fat. Skinless chicken or [place]. Ground chicken or [place]. Pork trimmed of fat. All fish and seafood. Eggs. Dried beans, peas, or lentils. Unsalted nuts and seeds. Unsalted canned beans. Dairy Low-fat dairy products, such as skim or 1% milk, 2% or reduced-fat cheeses, low-fat ricotta or cottage cheese, or plain low-fat yogurt. Low-sodium or reduced-sodium cheeses. Fats and Oils Tub margarines without trans fats. Light or reduced-fat mayonnaise and salad dressings (reduced sodium). Avocado. Safflower, olive, or canola oils. Natural peanut or almond butter. Other Unsalted popcorn and pretzels. The  items listed above may not be a complete list of recommended foods or beverages. Contact your dietitian for more options. WHAT FOODS ARE NOT RECOMMENDED? Grains White bread. White pasta. White rice. Refined cornbread. Bagels and croissants. Crackers that contain trans fat. Vegetables Creamed or fried vegetables. Vegetables in a  cheese sauce. Regular canned vegetables. Regular canned tomato sauce and paste. Regular tomato and vegetable juices. Fruits Dried fruits. Canned fruit in light or heavy syrup. Fruit juice. Meat and Other Protein Products Fatty cuts of meat. Ribs, chicken wings, bacon, sausage, bologna, salami, chitterlings, fatback, hot dogs, bratwurst, and packaged luncheon meats. Salted nuts and seeds. Canned beans with salt. Dairy Whole or 2% milk, cream, half-and-half, and cream cheese. Whole-fat or sweetened yogurt. Full-fat cheeses or blue cheese. Nondairy creamers and whipped toppings. Processed cheese, cheese spreads, or cheese curds. Condiments Onion and garlic salt, seasoned salt, table salt, and sea salt. Canned and packaged gravies. Worcestershire sauce. Tartar sauce. Barbecue sauce. Teriyaki sauce. Soy sauce, including reduced sodium. Steak sauce. Fish sauce. Oyster sauce. Cocktail sauce. Horseradish. Ketchup and mustard. Meat flavorings and tenderizers. Bouillon cubes. Hot sauce. Tabasco sauce. Marinades. Taco seasonings. Relishes. Fats and Oils Butter, stick margarine, lard, shortening, ghee, and bacon fat. Coconut, palm kernel, or palm oils. Regular salad dressings. Other Pickles and olives. Salted popcorn and pretzels. The items listed above may not be a complete list of foods and beverages to avoid. Contact your dietitian for more information. WHERE CAN I FIND MORE INFORMATION? National Heart, Lung, and Blood Institute: [employer] Document Released: 01/14/2011 Document Revised: 06/11/2013 Document Reviewed: 11/29/2012 [facility] Patient Information 2015 [place], [place]. This information is not intended to replace advice given to you by your health care provider. Make sure you discuss any questions you have with your health care provider.

## 2014-09-12 NOTE — Assessment & Plan Note (Signed)
Depression: Refilled zoloft Counseling services are also available on site

## 2014-09-12 NOTE — Assessment & Plan Note (Signed)
HTN: BP above goal Med: compliant  BP goal is < 140/90 Continue prinzide, increase to 20-12.5, two tabs once daily in morning Add novasc 5 mg in evening  With plan to increase to 10 mg in evening if BP above goal  Low salt diet recommended

## 2014-09-12 NOTE — Progress Notes (Signed)
   Subjective:    Patient ID: Joshua Daugherty, male    DOB: Feb 20, 1970, 44 y.o.   MRN: [id number] CC: ED f/u for HTN  HPI 44 yo M presents to establish care and discuss the following:  1. HTN: dx in 2000. Had periods of intermittent treatment due to lack of health insurance. Has blurry vision at times. No HA, CP or SOB. Taking prinzide 20-25 once daily.   2. Depression: for many years. Has a history of ETOH and cocaine use at times but denies recently. Requesting zoloft refills. Reports that he does not have time to go to [facility] due to working full time.   Med Hx: HTN dx in  History  Substance Use Topics  . Smoking status: Current Every Day Smoker  . Smokeless tobacco: Not on file  . Alcohol Use: Yes  Fam Hx: HTN in mother   Review of Systems  Constitutional: Negative for fever, chills, fatigue and unexpected weight change.  HENT: Positive for dental problem.   Eyes: Negative for visual disturbance.  Respiratory: Negative for cough and shortness of breath.   Cardiovascular: Negative for chest pain, palpitations and leg swelling.  Gastrointestinal: Negative for nausea, vomiting, abdominal pain, diarrhea, constipation and blood in stool.  Endocrine: Negative for polydipsia, polyphagia and polyuria.  Musculoskeletal: Negative for myalgias, back pain, arthralgias, gait problem and neck pain.  Skin: Negative for rash.  Allergic/Immunologic: Negative for immunocompromised state.  Hematological: Negative for adenopathy. Does not bruise/bleed easily.  Psychiatric/Behavioral: Positive for dysphoric mood. Negative for suicidal ideas and sleep disturbance. The patient is not nervous/anxious.       Objective:   Physical Exam  Constitutional: He appears well-developed and well-nourished. No distress.  HENT:  Head: Normocephalic and atraumatic.  Neck: Normal range of motion. Neck supple.  Cardiovascular: Normal rate, regular rhythm, normal heart sounds and intact distal pulses.     Pulmonary/Chest: Effort normal and breath sounds normal.  Musculoskeletal: He exhibits no edema.  Neurological: He is alert.  Skin: Skin is warm and dry. No rash noted. No erythema.  Psychiatric: He has a normal mood and affect.  BP 164/112 mmHg  Pulse 85  Temp(Src) 99.2 F (37.3 C) (Oral)  Resp 18  Ht  (1.854 m)  Wt 228 lb (103.42 kg)  BMI 30.09 kg/m2  SpO2 97%  Wt Readings from Last 3 Encounters:  09/12/14 228 lb (103.42 kg)  02/22/14 240 lb (108.863 kg)  04/03/13 236 lb 8 oz (107.276 kg)   BP Readings from Last 3 Encounters:  09/12/14 164/112  09/02/14 172/112  02/22/14 177/98       Assessment & Plan:

## 2014-09-12 NOTE — Assessment & Plan Note (Signed)
A: low sex drive P: Checking testosterone level

## 2014-09-13 LAB — BASIC METABOLIC PANEL
BUN: 15 mg/dL (ref 7–25)
CO2: 27 mmol/L (ref 20–31)
CREATININE: 1.23 mg/dL (ref 0.60–1.35)
Calcium: 9.7 mg/dL (ref 8.6–10.3)
Chloride: 102 mmol/L (ref 98–110)
GLUCOSE: 91 mg/dL (ref 65–99)
Potassium: 4.1 mmol/L (ref 3.5–5.3)
Sodium: 140 mmol/L (ref 135–146)

## 2014-09-13 LAB — LIPID PANEL
Cholesterol: 144 mg/dL (ref 125–200)
HDL: 61 mg/dL (ref >=40)
LDL CALC: 72 mg/dL (ref <130)
TRIGLYCERIDES: 57 mg/dL (ref <150)
Total CHOL/HDL Ratio: 2.4 Ratio (ref <=5.0)
VLDL: 11 mg/dL (ref <30)

## 2014-09-13 LAB — HIV ANTIBODY (ROUTINE TESTING W REFLEX): HIV 1&2 Ab, 4th Generation: NONREACTIVE

## 2014-09-13 LAB — TESTOSTERONE: Testosterone: 435 ng/dL (ref 300–890)

## 2014-10-11 ENCOUNTER — Telehealth: Payer: Self-pay | Admitting: *Deleted

## 2014-10-11 NOTE — Telephone Encounter (Signed)
-----   Message from Dessa Phi, MD sent at 09/13/2014  9:17 AM EDT ----- All labs including testosterone level normal

## 2014-10-17 NOTE — Telephone Encounter (Signed)
LVM to return call.

## 2014-11-04 NOTE — Telephone Encounter (Signed)
Patient called back, please f/u with pt.  °

## 2014-11-07 NOTE — Telephone Encounter (Signed)
Pt returned call  Date of birth verified by pt  Normal lab results given

## 2014-11-14 ENCOUNTER — Ambulatory Visit: Payer: Self-pay | Attending: Family Medicine | Admitting: Pharmacist

## 2014-11-14 DIAGNOSIS — I10 Essential (primary) hypertension: Secondary | ICD-10-CM | POA: Insufficient documentation

## 2014-11-14 DIAGNOSIS — Z9114 Patient's other noncompliance with medication regimen: Secondary | ICD-10-CM | POA: Insufficient documentation

## 2014-11-14 DIAGNOSIS — M25512 Pain in left shoulder: Secondary | ICD-10-CM | POA: Insufficient documentation

## 2014-11-14 DIAGNOSIS — Z79899 Other long term (current) drug therapy: Secondary | ICD-10-CM | POA: Insufficient documentation

## 2014-11-14 NOTE — Progress Notes (Signed)
S:    Patient arrives in good spirits. Presents to the clinic for hypertension evaluation.   Patient denies adherence with medications. He is NOT taking the amlodipine because he was afraid to start a new medication and heard that it "hurts your heart".   Current BP Medications include:  Lisinopril-HCTZ 20-12.5: 2 tablets daily.   Patient reports nerve pain in his left shoulder. He is not sure if he injured it at work but it is painful and it makes it difficult for him to sleep.   Patient also reports some cough, especially at night. Patient denies headache, chest pain, and difficulty breathing.   O:   Last 3 Office BP readings: BP Readings from Last 3 Encounters:  09/12/14 164/112  09/02/14 172/112  02/22/14 177/98    BMET    Component Value Date/Time   NA 140 09/12/2014 1729   K 4.1 09/12/2014 1729   CL 102 09/12/2014 1729   CO2 27 09/12/2014 1729   GLUCOSE 91 09/12/2014 1729   BUN 15 09/12/2014 1729   CREATININE 1.23 09/12/2014 1729   CREATININE 1.29 02/22/2014 0310   CALCIUM 9.7 09/12/2014 1729   GFRNONAA 66* 02/22/2014 0310   GFRAA 76* 02/22/2014 0310    A/P: History of hypertension currently UNcontrolled on current medications due to noncompliance with the amlodipine. Discussed amlodipine with the patient and instructed him to begin taking amlodipine 5 mg daily as prescribed by Dr. Armen Pickup. Review adverse effects of amlodipine and the consequences of hypertension. Patient to monitor cough over the next few weeks to better describe it - may warrant change from ACEi to ARB. Follow up blood pressure at next visit with Dr. Armen Pickup.   Results reviewed and written information provided.   Total time in face-to-face counseling 20 minutes.   F/U Clinic Visit with Dr. Armen Pickup for shoulder pain and with me as needed for blood pressure after that visit. Joshua Daugherty

## 2014-11-14 NOTE — Patient Instructions (Signed)
Thanks for coming to see me!  Start taking the amlodipine 5 mg daily. We need to get your blood pressure down to prevent things like heart attack, stroke, and kidney disease  Make an appointment with Dr. Armen Pickup for your shoulder pain

## 2015-01-14 ENCOUNTER — Ambulatory Visit: Payer: Self-pay | Admitting: Family Medicine

## 2015-02-06 ENCOUNTER — Ambulatory Visit: Payer: Self-pay | Admitting: Family Medicine

## 2015-04-09 MED FILL — ?SERTRALINE HCL 50 MG TABLE: 50 | 30 days supply | Qty: 30 | Fill #4

## 2015-04-09 MED FILL — LISINOPRIL-HCTZ 20-12.5 MG: 20-12.5 | 30 days supply | Qty: 60 | Fill #4

## 2015-04-09 MED FILL — AMLODIPINE BESYLATE 5 MG TA: 5 | 30 days supply | Qty: 30 | Fill #3

## 2015-05-13 ENCOUNTER — Emergency Department (HOSPITAL_COMMUNITY)
Admission: EM | Admit: 2015-05-13 | Discharge: 2015-05-13 | Disposition: A | Discharge status: Home / Self Care | Payer: Self-pay | Attending: Emergency Medicine | Admitting: Emergency Medicine

## 2015-05-13 ENCOUNTER — Encounter (HOSPITAL_COMMUNITY): Payer: Self-pay

## 2015-05-13 DIAGNOSIS — I10 Essential (primary) hypertension: Secondary | ICD-10-CM | POA: Insufficient documentation

## 2015-05-13 DIAGNOSIS — B029 Zoster without complications: Secondary | ICD-10-CM | POA: Insufficient documentation

## 2015-05-13 DIAGNOSIS — Z880 Allergy status to penicillin: Secondary | ICD-10-CM | POA: Insufficient documentation

## 2015-05-13 DIAGNOSIS — F17210 Nicotine dependence, cigarettes, uncomplicated: Secondary | ICD-10-CM | POA: Insufficient documentation

## 2015-05-13 DIAGNOSIS — Z79899 Other long term (current) drug therapy: Secondary | ICD-10-CM | POA: Insufficient documentation

## 2015-05-13 MED ORDER — ACYCLOVIR 400 MG PO TABS: 400.0000 mg | ORAL_TABLET | Freq: Every day | ORAL | Status: DC

## 2015-05-13 MED ORDER — OXYCODONE-ACETAMINOPHEN 5-325 MG PO TABS: 1.0000 | ORAL_TABLET | ORAL | Status: DC | PRN

## 2015-05-13 NOTE — Discharge Instructions (Signed)
1. Continue home medications. 2. Start taking Acyclovir FIVE times a day for the next 7 days.  Also, you may take Percocet every 4 hours as needed for pain.  3.  Follow up with your PCP in 2-3 days for re-evaluation.    Shingles Shingles, which is also known as herpes zoster, is an infection that causes a painful skin rash and fluid-filled blisters. Shingles is not related to genital herpes, which is a sexually transmitted infection.   Shingles only develops in people who:  Have had chickenpox.  Have received the chickenpox vaccine. (This is rare.) CAUSES Shingles is caused by varicella-zoster virus (VZV). This is the same virus that causes chickenpox. After exposure to VZV, the virus stays in the body in an inactive (dormant) state. Shingles develops if the virus reactivates. This can happen many years after the initial exposure to VZV. It is not known what causes this virus to reactivate. RISK FACTORS People who have had chickenpox or received the chickenpox vaccine are at risk for shingles. Infection is more common in people who:  Are older than age 39.  Have a weakened defense (immune) system, such as those with HIV, AIDS, or cancer.  Are taking medicines that weaken the immune system, such as transplant medicines.  Are under great stress. SYMPTOMS Early symptoms of this condition include itching, tingling, and pain in an area on your skin. Pain may be described as burning, stabbing, or throbbing. A few days or weeks after symptoms start, a painful red rash appears, usually on one side of the body in a bandlike or beltlike pattern. The rash eventually turns into fluid-filled blisters that break open, scab over, and dry up in about 2-3 weeks. At any time during the infection, you may also develop:  A fever.  Chills.  A headache.  An upset stomach. DIAGNOSIS This condition is diagnosed with a skin exam. Sometimes, skin or fluid samples are taken from the blisters before a  diagnosis is made. These samples are examined under a microscope or sent to a lab for testing. TREATMENT There is no specific cure for this condition. Your health care provider will probably prescribe medicines to help you manage pain, recover more quickly, and avoid long-term problems. Medicines may include:  Antiviral drugs.  Anti-inflammatory drugs.  Pain medicines. If the area involved is on your face, you may be referred to a specialist, such as an eye doctor (ophthalmologist) or an ear, nose, and throat (ENT) doctor to help you avoid eye problems, chronic pain, or disability. HOME CARE INSTRUCTIONS Medicines  Take medicines only as directed by your health care provider.  Apply an anti-itch or numbing cream to the affected area as directed by your health care provider. Blister and Rash Care  Take a cool bath or apply cool compresses to the area of the rash or blisters as directed by your health care provider. This may help with pain and itching.  Keep your rash covered with a loose bandage (dressing). Wear loose-fitting clothing to help ease the pain of material rubbing against the rash.  Keep your rash and blisters clean with mild soap and cool water or as directed by your health care provider.  Check your rash every day for signs of infection. These include redness, swelling, and pain that lasts or increases.  Do not pick your blisters.  Do not scratch your rash. General Instructions  Rest as directed by your health care provider.  Keep all follow-up visits as directed by your health  care provider. This is important.  Until your blisters scab over, your infection can cause chickenpox in people who have never had it or been vaccinated against it. To prevent this from happening, avoid contact with other people, especially:  Babies.  Pregnant women.  Children who have eczema.  Elderly people who have transplants.  People who have chronic illnesses, such as leukemia or  AIDS. SEEK MEDICAL CARE IF:  Your pain is not relieved with prescribed medicines.  Your pain does not get better after the rash heals.  Your rash looks infected. Signs of infection include redness, swelling, and pain that lasts or increases. SEEK IMMEDIATE MEDICAL CARE IF:  The rash is on your face or nose.  You have facial pain, pain around your eye area, or loss of feeling on one side of your face.  You have ear pain or you have ringing in your ear.  You have loss of taste.  Your condition gets worse.   This information is not intended to replace advice given to you by your health care provider. Make sure you discuss any questions you have with your health care provider.   Document Released: 01/25/2005 Document Revised: 02/15/2014 Document Reviewed: 12/06/2013 Elsevier Interactive Patient Education [employer]2016 Elsevier Inc.

## 2015-05-13 NOTE — ED Provider Notes (Signed)
CSN: [id number]     Arrival date & time 05/13/15  0702 History   First MD Initiated Contact with Patient 05/13/15 [phone]     Chief Complaint  Patient presents with  . Rash     (Consider location/radiation/quality/duration/timing/severity/associated sxs/prior Treatment) Patient is a 45 y.o. male presenting with rash.  Rash Associated symptoms: no abdominal pain, no fever, no nausea, no shortness of breath and not vomiting     Joshua Daugherty is a 45 y.o. male with PMH significant for HTN who presents with 5 day history of gradual onset, constant, moderate, painful, worsening rash to left hip.  Patient reports it started out as what appeared to be 2 mosquito bites on his left hip and over the course of the past 5 days has spread to the left lateral hip and left upper thigh.  He describes it as painful, burning, warm, and it may or may not be draining.  He reports using anti-itch cream, triamcinolone cream, and washing it with soap and water with mild relief.  He denies any changes in medications/soaps/detergenets.  He denies fever, chills, N/V, abdominal pain, or urinary symptoms.   Past Medical History  Diagnosis Date  . Hypertension 2000   History reviewed. No pertinent past surgical history. Family History  Problem Relation Age of Onset  . Hypertension Mother   . Hypertension Maternal Grandmother   . Diabetes Maternal Grandmother   . Diabetes Paternal Grandmother    Social History  Substance Use Topics  . Smoking status: Current Some Day Smoker    Types: Cigarettes  . Smokeless tobacco: Never Used  . Alcohol Use: 2.4 oz/week    2 Shots of liquor, 2 Cans of beer per week     Comment: on weekends     Review of Systems  Constitutional: Negative for fever.  Respiratory: Negative for shortness of breath.   Gastrointestinal: Negative for nausea, vomiting and abdominal pain.  Skin: Positive for rash.  All other systems reviewed and are negative.     Allergies  Penicillins;  Citric acid; and Other  Home Medications   Prior to Admission medications   Medication Sig Start Date End Date Taking? Authorizing Provider  amLODipine (NORVASC) 5 MG tablet Take 1 tablet (5 mg total) by mouth every evening. Patient not taking: Reported on 11/14/2014 09/12/14   Dessa Phi, MD  lisinopril-hydrochlorothiazide (ZESTORETIC) 20-12.5 MG per tablet Take 2 tablets by mouth daily. 09/12/14   Josalyn Funches, MD  sertraline (ZOLOFT) 50 MG tablet Take 1 tablet (50 mg total) by mouth daily. 09/12/14   Josalyn Funches, MD   BP 159/108 mmHg  Pulse 89  Temp(Src) 98.9 F (37.2 C) (Oral)  Ht  (1.854 m)  Wt 99.791 kg  BMI 29.03 kg/m2  SpO2 96% Physical Exam  Constitutional: He is oriented to person, place, and time. He appears well-developed and well-nourished.  HENT:  Head: Normocephalic and atraumatic.  Right Ear: External ear normal.  Left Ear: External ear normal.  Eyes: Conjunctivae are normal. No scleral icterus.  Neck: No tracheal deviation present.  Pulmonary/Chest: Effort normal. No respiratory distress.  Abdominal: He exhibits no distension.  Musculoskeletal: Normal range of motion.  Neurological: He is alert and oriented to person, place, and time.  Skin: Skin is warm and dry.  Linear dermatomal rash consistent with shingles on left upper leg.  No active drainage.  Psychiatric: He has a normal mood and affect. His behavior is normal.    ED Course  Procedures (including critical  care time) Labs Review Labs Reviewed - No data to display  Imaging Review No results found. I have personally reviewed and evaluated these images and lab results as part of my medical decision-making.   EKG Interpretation None      MDM   Final diagnoses:  Shingles   VSS, NAD.  Patient appears well, non-toxic.  Will discharge home with Acyclovir and Percocet.  Advised patient to keep area covered and to wear loose fitting clothes.  Follow up PCP.  Discussed return precautions.   Patient agrees and acknowledges the above plan for discharge.     Cheri FowlerKayla Gracious Renken, PA-C 05/13/15 0745  Laurence Spatesachel Morgan Little, MD 05/13/15 [phone]

## 2015-05-13 NOTE — ED Notes (Signed)
Per PT, Pt had "two mosquito bites" on his left flank that started on Thursday. Pt traveled during the weekend and tried to treat rash with ointment and benadryl. Pt reports rash spreading. Rash is a papular rash that spreads from left flank to the groin. Pt reports severe pain with contact.

## 2015-05-13 NOTE — ED Notes (Signed)
MD at the bedside  

## 2015-07-02 ENCOUNTER — Telehealth: Payer: Self-pay | Admitting: Family Medicine

## 2015-07-02 DIAGNOSIS — F32A Depression, unspecified: Secondary | ICD-10-CM

## 2015-07-02 DIAGNOSIS — F329 Major depressive disorder, single episode, unspecified: Secondary | ICD-10-CM

## 2015-07-02 DIAGNOSIS — I10 Essential (primary) hypertension: Secondary | ICD-10-CM

## 2015-07-02 NOTE — Telephone Encounter (Signed)
Pt. Called requesting a refill on the following medications:  sertraline (ZOLOFT) 50 MG tablet  lisinopril-hydrochlorothiazide (ZESTORETIC) 20-12.5 MG per tablet  amLODipine (NORVASC) 5 MG tablet   Please f/u with pt.

## 2015-07-03 MED ORDER — SERTRALINE HCL 50 MG PO TABS: 50.0000 mg | ORAL_TABLET | Freq: Every day | ORAL | Status: DC

## 2015-07-03 MED ORDER — AMLODIPINE BESYLATE 5 MG PO TABS: 5.0000 mg | ORAL_TABLET | Freq: Every evening | ORAL | Status: DC

## 2015-07-03 MED ORDER — LISINOPRIL-HYDROCHLOROTHIAZIDE 20-12.5 MG PO TABS: 2.0000 | ORAL_TABLET | Freq: Every day | ORAL | Status: DC

## 2015-07-03 NOTE — Telephone Encounter (Signed)
Refilled medications - patient must have office visit prior to further refills.

## 2015-07-18 MED FILL — ?SERTRALINE HCL 50 MG TABLE: 50 | 30 days supply | Qty: 30 | Fill #0

## 2015-07-18 MED FILL — AMLODIPINE BESYLATE 5 MG TA: 5 | 30 days supply | Qty: 30 | Fill #0

## 2015-07-18 MED FILL — LISINOPRIL-HCTZ 20-12.5 MG: 20-12.5 | 30 days supply | Qty: 60 | Fill #0

## 2015-08-29 ENCOUNTER — Encounter: Payer: Self-pay | Admitting: Family Medicine

## 2015-08-29 ENCOUNTER — Ambulatory Visit: Payer: 59 | Attending: Family Medicine | Admitting: Family Medicine

## 2015-08-29 VITALS — BP 138/92 | HR 75 | Temp 98.5°F | Ht 73.0 in | Wt 228.4 lb

## 2015-08-29 DIAGNOSIS — I10 Essential (primary) hypertension: Secondary | ICD-10-CM | POA: Diagnosis not present

## 2015-08-29 DIAGNOSIS — R6882 Decreased libido: Secondary | ICD-10-CM | POA: Diagnosis not present

## 2015-08-29 DIAGNOSIS — F329 Major depressive disorder, single episode, unspecified: Secondary | ICD-10-CM | POA: Diagnosis not present

## 2015-08-29 DIAGNOSIS — Z720 Tobacco use: Secondary | ICD-10-CM | POA: Diagnosis not present

## 2015-08-29 DIAGNOSIS — F32A Depression, unspecified: Secondary | ICD-10-CM

## 2015-08-29 DIAGNOSIS — F17210 Nicotine dependence, cigarettes, uncomplicated: Secondary | ICD-10-CM | POA: Insufficient documentation

## 2015-08-29 MED ORDER — SERTRALINE HCL 50 MG PO TABS: 50.0000 mg | ORAL_TABLET | Freq: Every day | ORAL | Status: DC

## 2015-08-29 MED ORDER — CLONIDINE HCL 0.1 MG PO TABS
0.2000 mg | ORAL_TABLET | Freq: Once | ORAL | Status: AC
  Administered 2015-08-29: 0.2 mg via ORAL

## 2015-08-29 MED ORDER — LISINOPRIL-HYDROCHLOROTHIAZIDE 20-12.5 MG PO TABS: 2.0000 | ORAL_TABLET | Freq: Every day | ORAL | Status: DC

## 2015-08-29 MED ORDER — AMLODIPINE BESYLATE 10 MG PO TABS: 10.0000 mg | ORAL_TABLET | Freq: Every evening | ORAL | Status: DC

## 2015-08-29 MED FILL — ?AMLODIPINE BESYLATE 10 MG: 10 | 30 days supply | Qty: 30 | Fill #0

## 2015-08-29 MED FILL — LISINOPRIL-HCTZ 20-12.5 MG: 20-12.5 | 30 days supply | Qty: 60 | Fill #0

## 2015-08-29 MED FILL — SERTRALINE HCL 50 MG TABLET: 50 | 30 days supply | Qty: 30 | Fill #0

## 2015-08-29 NOTE — Assessment & Plan Note (Signed)
A: elevated BP treated in office. P: Increase norvasc to 10 mg daily Increase lisinopril-HCTZ to 40-25 mg daily  Continue low salt diet

## 2015-08-29 NOTE — Patient Instructions (Addendum)
Joshua Daugherty was seen today for follow-up.  Diagnoses and all orders for this visit:  Essential hypertension -     BASIC METABOLIC PANEL WITH GFR -     Lipid Panel -     HgB A1c -     cloNIDine (CATAPRES) tablet 0.2 mg; Take 2 tablets (0.2 mg total) by mouth once. -     Discontinue: amLODipine (NORVASC) 10 MG tablet; Take 1 tablet (10 mg total) by mouth every evening. Must have OV for refills -     lisinopril-hydrochlorothiazide (ZESTORETIC) 20-12.5 MG tablet; Take 2 tablets by mouth daily. -     amLODipine (NORVASC) 10 MG tablet; Take 1 tablet (10 mg total) by mouth every evening.  Depression -     sertraline (ZOLOFT) 50 MG tablet; Take 1 tablet (50 mg total) by mouth daily.   Please go for lab draw fasting at your earliest convenience Friday August 11. No appointment needed The earlier you go the shorter your wait time will be  F/u in 4 weeks for HTN/BP check   Dr. Armen PickupFunches

## 2015-08-29 NOTE — Progress Notes (Signed)
Subjective:  Patient ID: Joshua Daugherty, male    DOB: Aug 18, 1970  Age: 45 y.o. MRN: [id number]  CC: Follow-up   HPI Emidio A Samuel BoucheLucas has HTN, depression, smoking he presents for   1. CHRONIC HYPERTENSION  Disease Monitoring  Blood pressure range: not checking   Chest pain: no   Dyspnea: no   Claudication: no   Medication compliance: yes  Medication Side Effects  Lightheadedness: no   Urinary frequency: no   Edema: no   Impotence: yes   Preventitive Healthcare:  Exercise: yes   Diet Pattern: cooking and eating at home   Salt Restriction: yes   2. Decreased sex drive: this is chronic. He takes zoloft for depression and does not want to stop SSRI. He takes HCTZ for BP control. He and his partner both seem to have low interest in sex. He has 2 yo child. He is bothered by this but is more focused on work, family and his personal health.   Social History  Substance Use Topics  . Smoking status: Current Some Day Smoker    Types: Cigarettes  . Smokeless tobacco: Never Used  . Alcohol Use: 2.4 oz/week    2 Shots of liquor, 2 Cans of beer per week     Comment: on weekends     Outpatient Prescriptions Prior to Visit  Medication Sig Dispense Refill  . amLODipine (NORVASC) 5 MG tablet Take 1 tablet (5 mg total) by mouth every evening. Must have OV for refills 30 tablet 0  . lisinopril-hydrochlorothiazide (ZESTORETIC) 20-12.5 MG tablet Take 2 tablets by mouth daily. Must have OV for refills 60 tablet 0  . sertraline (ZOLOFT) 50 MG tablet Take 1 tablet (50 mg total) by mouth daily. Must have OV for refills 30 tablet 0  . acyclovir (ZOVIRAX) 400 MG tablet Take 1 tablet (400 mg total) by mouth 5 (five) times daily. (Patient not taking: Reported on 08/29/2015) 35 tablet 0  . oxyCODONE-acetaminophen (PERCOCET/ROXICET) 5-325 MG tablet Take 1 tablet by mouth every 4 (four) hours as needed for severe pain. (Patient not taking: Reported on 08/29/2015) 20 tablet 0   No facility-administered  medications prior to visit.    ROS Review of Systems  Constitutional: Negative for fever, chills, fatigue and unexpected weight change.  HENT: Positive for dental problem.   Eyes: Positive for visual disturbance.  Respiratory: Negative for cough and shortness of breath.   Cardiovascular: Negative for chest pain, palpitations and leg swelling.  Gastrointestinal: Negative for nausea, vomiting, abdominal pain, diarrhea, constipation and blood in stool.  Endocrine: Negative for polydipsia, polyphagia and polyuria.  Musculoskeletal: Negative for myalgias, back pain, arthralgias, gait problem and neck pain.  Skin: Negative for rash.  Allergic/Immunologic: Negative for immunocompromised state.  Hematological: Negative for adenopathy. Does not bruise/bleed easily.  Psychiatric/Behavioral: Positive for dysphoric mood. Negative for suicidal ideas and sleep disturbance. The patient is not nervous/anxious.     Objective:  BP 164/120 mmHg  Pulse 75  Temp(Src) 98.5 F (36.9 C) (Oral)  Ht 6\' 1"  (1.854 m)  Wt 228 lb 6.4 oz (103.602 kg)  BMI 30.14 kg/m2  SpO2 96%  BP/Weight 08/29/2015 05/13/2015 09/12/2014  Systolic BP 164 159 164  Diastolic BP 120 110 112  Wt. (Lbs) 228.4 220 228  BMI 30.14 29.03 30.09   Physical Exam  Constitutional: He appears well-developed and well-nourished. No distress.  HENT:  Head: Normocephalic and atraumatic.  Mouth/Throat: Abnormal dentition.  Eyes: Conjunctivae are normal. Pupils are equal, round, and reactive  to light. Right eye exhibits nystagmus (R eye horizontal nystagmus with lateral gaze ).  Neck: Normal range of motion. Neck supple. Carotid bruit is not present.  Cardiovascular: Normal rate, regular rhythm, normal heart sounds and intact distal pulses.   Pulmonary/Chest: Effort normal and breath sounds normal.  Musculoskeletal: He exhibits no edema.  Neurological: He is alert.  Skin: Skin is warm and dry. No rash noted. No erythema.  Psychiatric: He has a  normal mood and affect.   Social History  Substance Use Topics  . Smoking status: Current Some Day Smoker    Types: Cigarettes  . Smokeless tobacco: Never Used  . Alcohol Use: 2.4 oz/week    2 Shots of liquor, 2 Cans of beer per week     Comment: on weekends      Assessment & Plan:   Julien was seen today for follow-up.  Diagnoses and all orders for this visit:  Essential hypertension -     BASIC METABOLIC PANEL WITH GFR -     Lipid Panel -     Cancel: HgB A1c -     cloNIDine (CATAPRES) tablet 0.2 mg; Take 2 tablets (0.2 mg total) by mouth once. -     Discontinue: amLODipine (NORVASC) 10 MG tablet; Take 1 tablet (10 mg total) by mouth every evening. Must have OV for refills -     lisinopril-hydrochlorothiazide (ZESTORETIC) 20-12.5 MG tablet; Take 2 tablets by mouth daily. -     amLODipine (NORVASC) 10 MG tablet; Take 1 tablet (10 mg total) by mouth every evening.  Depression -     sertraline (ZOLOFT) 50 MG tablet; Take 1 tablet (50 mg total) by mouth daily.  Light smoker  Decreased sex drive   No orders of the defined types were placed in this encounter.    Follow-up: Return in about 4 weeks (around 09/26/2015) for HTN .   Dessa Phi MD

## 2015-08-29 NOTE — Assessment & Plan Note (Signed)
Decreased sex drive on SSRI, HCTZ, uncontrolled HTN  Plan: Focus on BP control

## 2015-08-29 NOTE — Assessment & Plan Note (Signed)
Advised complete avoidance of cigarettes

## 2015-11-24 MED FILL — ?AMLODIPINE BESYLATE 10 MG: 10 | 30 days supply | Qty: 30 | Fill #0

## 2015-11-24 MED FILL — ?SERTRALINE HCL 50 MG TABLE: 50 | 30 days supply | Qty: 30 | Fill #1

## 2015-11-24 MED FILL — LISINOPRIL-HCTZ 20-12.5 MG: 20-12.5 | 30 days supply | Qty: 60 | Fill #1

## 2015-12-26 MED FILL — LISINOPRIL-HCTZ 20-12.5 MG: 20-12.5 | 30 days supply | Qty: 60 | Fill #2

## 2015-12-26 MED FILL — ?SERTRALINE HCL 50 MG TABLE: 50 | 30 days supply | Qty: 30 | Fill #2

## 2015-12-26 MED FILL — ?AMLODIPINE BESYLATE 10 MG: 10 | 30 days supply | Qty: 30 | Fill #1

## 2016-02-27 ENCOUNTER — Telehealth: Payer: Self-pay | Admitting: Family Medicine

## 2016-02-27 DIAGNOSIS — F32A Depression, unspecified: Secondary | ICD-10-CM

## 2016-02-27 DIAGNOSIS — F329 Major depressive disorder, single episode, unspecified: Secondary | ICD-10-CM

## 2016-02-27 NOTE — Telephone Encounter (Signed)
Patient came in today requesting a refill on all 3 of his current meds. Please f/u.

## 2016-03-02 MED FILL — LISINOPRIL-HCTZ 20-12.5 MG: 20-12.5 | 30 days supply | Qty: 60 | Fill #3

## 2016-03-02 MED FILL — AMLODIPINE BESYLATE 10 MG T: 10 | 30 days supply | Qty: 30 | Fill #2

## 2016-03-02 MED FILL — ?SERTRALINE HCL 50 MG TABLE: 50 | 30 days supply | Qty: 30 | Fill #3

## 2016-03-03 ENCOUNTER — Other Ambulatory Visit: Payer: Self-pay

## 2016-03-03 DIAGNOSIS — I10 Essential (primary) hypertension: Secondary | ICD-10-CM

## 2016-03-03 MED ORDER — AMLODIPINE BESYLATE 10 MG PO TABS: 10.0000 mg | ORAL_TABLET | Freq: Every evening | ORAL | 3 refills | Status: DC

## 2016-03-03 MED ORDER — LISINOPRIL-HYDROCHLOROTHIAZIDE 20-12.5 MG PO TABS: 2.0000 | ORAL_TABLET | Freq: Every day | ORAL | 3 refills | Status: DC

## 2016-03-03 NOTE — Telephone Encounter (Signed)
I refilled his BP medication. He needs a refill on Zoloft.

## 2016-03-04 MED ORDER — SERTRALINE HCL 50 MG PO TABS
50.0000 mg | ORAL_TABLET | Freq: Every day | ORAL | 3 refills | Status: DC
Start: 2016-03-04 — End: 2016-05-19

## 2016-05-19 ENCOUNTER — Other Ambulatory Visit: Payer: Self-pay | Admitting: Family Medicine

## 2016-05-19 DIAGNOSIS — F32A Depression, unspecified: Secondary | ICD-10-CM

## 2016-05-19 DIAGNOSIS — F329 Major depressive disorder, single episode, unspecified: Secondary | ICD-10-CM

## 2016-06-04 MED FILL — ?SERTRALINE HCL 50 MG TABLE: 50 | 30 days supply | Qty: 30 | Fill #0

## 2016-06-04 MED FILL — AMLODIPINE BESYLATE 10 MG T: 10 | 30 days supply | Qty: 30 | Fill #0

## 2016-06-04 MED FILL — LISINOPRIL-HCTZ 20-12.5 MG: 20-12.5 | 30 days supply | Qty: 60 | Fill #0

## 2016-07-21 ENCOUNTER — Other Ambulatory Visit: Payer: Self-pay | Admitting: Family Medicine

## 2016-07-21 DIAGNOSIS — F32A Depression, unspecified: Secondary | ICD-10-CM

## 2016-07-21 DIAGNOSIS — F329 Major depressive disorder, single episode, unspecified: Secondary | ICD-10-CM

## 2016-07-21 MED FILL — AMLODIPINE BESYLATE 10 MG T: 10 | 30 days supply | Qty: 30 | Fill #1

## 2016-07-21 MED FILL — LISINOPRIL-HCTZ 20-12.5 MG: 20-12.5 | 30 days supply | Qty: 60 | Fill #1

## 2016-07-22 MED FILL — ?SERTRALINE HCL 50 MG TAB: 50 | 30 days supply | Qty: 30 | Fill #0

## 2016-12-17 MED FILL — AMLODIPINE BESYLATE 10 MG T: 10 | 30 days supply | Qty: 30 | Fill #2

## 2016-12-17 MED FILL — LISINOPRIL-HCTZ 20-12.5 MG: 20-12.5 | 30 days supply | Qty: 60 | Fill #2

## 2016-12-24 ENCOUNTER — Ambulatory Visit: Payer: 59 | Admitting: Nurse Practitioner

## 2017-01-19 ENCOUNTER — Telehealth: Payer: Self-pay | Admitting: Family Medicine

## 2017-01-19 NOTE — Telephone Encounter (Signed)
Pt called to request a refill on  -sertraline (ZOLOFT) 50 MG tablet  Please follow up

## 2017-01-20 NOTE — Telephone Encounter (Signed)
Patient has not been seen in over a year, will forward to Loreen FreudZelda Flemming who patient is establishing with tomorrow.

## 2017-01-21 ENCOUNTER — Ambulatory Visit: Payer: 59 | Admitting: Nurse Practitioner

## 2017-01-21 NOTE — Telephone Encounter (Signed)
Denied. Needs office visit 

## 2017-02-11 ENCOUNTER — Ambulatory Visit: Payer: 59 | Admitting: Nurse Practitioner

## 2017-06-02 ENCOUNTER — Ambulatory Visit: Payer: Self-pay | Attending: Internal Medicine | Admitting: Internal Medicine

## 2017-06-02 ENCOUNTER — Encounter: Payer: Self-pay | Admitting: Internal Medicine

## 2017-06-02 VITALS — BP 181/121 | HR 92 | Temp 98.2°F | Resp 16 | Ht 73.0 in | Wt 234.8 lb

## 2017-06-02 DIAGNOSIS — I10 Essential (primary) hypertension: Secondary | ICD-10-CM | POA: Insufficient documentation

## 2017-06-02 DIAGNOSIS — F1290 Cannabis use, unspecified, uncomplicated: Secondary | ICD-10-CM | POA: Insufficient documentation

## 2017-06-02 DIAGNOSIS — Z880 Allergy status to penicillin: Secondary | ICD-10-CM | POA: Insufficient documentation

## 2017-06-02 DIAGNOSIS — F17210 Nicotine dependence, cigarettes, uncomplicated: Secondary | ICD-10-CM | POA: Insufficient documentation

## 2017-06-02 DIAGNOSIS — F1910 Other psychoactive substance abuse, uncomplicated: Secondary | ICD-10-CM

## 2017-06-02 DIAGNOSIS — F32A Depression, unspecified: Secondary | ICD-10-CM

## 2017-06-02 DIAGNOSIS — F329 Major depressive disorder, single episode, unspecified: Secondary | ICD-10-CM | POA: Insufficient documentation

## 2017-06-02 DIAGNOSIS — Z79899 Other long term (current) drug therapy: Secondary | ICD-10-CM | POA: Insufficient documentation

## 2017-06-02 MED ORDER — SERTRALINE HCL 50 MG PO TABS: 50.0000 mg | ORAL_TABLET | Freq: Every day | ORAL | 6 refills | Status: AC

## 2017-06-02 MED ORDER — LISINOPRIL-HYDROCHLOROTHIAZIDE 20-12.5 MG PO TABS: 2.0000 | ORAL_TABLET | Freq: Every day | ORAL | 3 refills | Status: AC

## 2017-06-02 MED ORDER — AMLODIPINE BESYLATE 10 MG PO TABS: 10.0000 mg | ORAL_TABLET | Freq: Every evening | ORAL | 3 refills | Status: AC

## 2017-06-02 MED FILL — AMLODIPINE BESYLATE 10 MG T: 10 | 30 days supply | Qty: 30 | Fill #0 | Status: TO

## 2017-06-02 MED FILL — SERTRALINE HCL 50 MG TABS: 50 | 30 days supply | Qty: 30 | Fill #0 | Status: TO

## 2017-06-02 MED FILL — LISINOPRIL-HCTZ 20-12.5 MG: 20-12.5 | 30 days supply | Qty: 60 | Fill #0 | Status: TO

## 2017-06-02 NOTE — Patient Instructions (Signed)
Your blood pressure is uncontrolled.  Uncontrolled blood pressure can increase your risk for heart attack and strokes.  Please restart the lisinopril/HCTZ and amlodipine. Work on trying to quit cigarette smoking and other street drugs.  Restart Zoloft for depression.

## 2017-06-02 NOTE — Progress Notes (Signed)
Pt states he has not been taking his bp medicine in 4-5 months  Pt states he ran out of his zoloft

## 2017-06-02 NOTE — Progress Notes (Signed)
Patient ID: Joshua Daugherty, male    DOB: September 06, 1970  MRN: [id number]  CC: re-establish and Hypertension   Subjective: Joshua Daugherty is a 47 y.o. male who presents for chronic ds management and to est with me as PCP. His concerns today include:  Pt with hx of HTN, depression, tob dep  1.  HTN:  Out of Lis/HCTZ and Amlodipine for several mths -trying to limit salt "but I'm not discipline in that area." -no device to check BP -headaches occasionally.  No CP, occasional SOB.  Reports blurred vision in mornings since he has been off meds.  Last eye exam several yrs ago  2.  Depression:  Request RF on Zoloft. Off x several mths. When I get of my medicines, I lean more to alcohol and drugs. Was using marijuana, ETOH and cocaine. Last used 2 wks ago.  Smoking about 1 cig/day. Family and kid worried about him. He really wants to get back on his meds because the urges for drugs and cig go away.  -admits to recent SI in past 2-3 mths.  Patient Active Problem List   Diagnosis Date Noted  . Light smoker 08/29/2015  . HTN (hypertension) 09/12/2014  . Depression 09/12/2014  . Decreased sex drive 82/95/621308/05/2014     Current Outpatient Medications on File Prior to Visit  Medication Sig Dispense Refill  . amLODipine (NORVASC) 10 MG tablet Take 1 tablet (10 mg total) by mouth every evening. (Patient not taking: Reported on 06/02/2017) 90 tablet 3  . lisinopril-hydrochlorothiazide (ZESTORETIC) 20-12.5 MG tablet Take 2 tablets by mouth daily. (Patient not taking: Reported on 06/02/2017) 180 tablet 3  . sertraline (ZOLOFT) 50 MG tablet TAKE 1 TABLET BY MOUTH DAILY. (Patient not taking: Reported on 06/02/2017) 30 tablet 0   No current facility-administered medications on file prior to visit.     Allergies  Allergen Reactions  . Penicillins Anaphylaxis  . Citric Acid Hives    High citric acid content foods  . Other Hives, Itching and Rash    "powder" in gloves-no latex allergy    Social History    Socioeconomic History  . Marital status: Legally Separated    Spouse name: Not on file  . Number of children: 4   . Years of education: 6312   . Highest education level: Not on file  Occupational History  . Occupation: [employer]    Comment: [employer]. Will be fulltime soon.   Social Needs  . Financial resource strain: Not on file  . Food insecurity:    Worry: Not on file    Inability: Not on file  . Transportation needs:    Medical: Not on file    Non-medical: Not on file  Tobacco Use  . Smoking status: Current Some Day Smoker    Types: Cigarettes  . Smokeless tobacco: Never Used  Substance and Sexual Activity  . Alcohol use: Yes    Alcohol/week: 2.4 oz    Types: 2 Shots of liquor, 2 Cans of beer per week    Comment: on weekends   . Drug use: No    Types: Cocaine  . Sexual activity: Yes    Birth control/protection: Condom  Lifestyle  . Physical activity:    Days per week: Not on file    Minutes per session: Not on file  . Stress: Not on file  Relationships  . Social connections:    Talks on phone: Not on file    Gets together: Not on file  Attends religious service: Not on file    Active member of club or organization: Not on file    Attends meetings of clubs or organizations: Not on file    Relationship status: Not on file  . Intimate partner violence:    Fear of current or ex partner: Not on file    Emotionally abused: Not on file    Physically abused: Not on file    Forced sexual activity: Not on file  Other Topics Concern  . Not on file  Social History Narrative  . Not on file    Family History  Problem Relation Age of Onset  . Hypertension Mother   . Hypertension Maternal Grandmother   . Diabetes Maternal Grandmother   . Diabetes Paternal Grandmother     No past surgical history on file.  ROS: Review of Systems  PHYSICAL EXAM: BP (!) 181/121   Pulse 92   Temp 98.2 F (36.8 C) (Oral)   Resp 16   Ht 6\' 1"  (1.854 m)   Wt 234 lb  12.8 oz (106.5 kg)   SpO2 97%   BMI 30.98 kg/m   Physical Exam  General appearance - alert, well appearing, and in no distress Mental status - alert, oriented to person, place, and time, normal mood, behavior, speech, dress, motor activity, and thought processes Eyes - pupils equal and reactive, extraocular eye movements intact Neck - supple, no significant adenopathy Chest - clear to auscultation, no wheezes, rales or rhonchi, symmetric air entry Heart - normal rate, regular rhythm, normal S1, S2, no murmurs, rubs, clicks or gallops Extremities - peripheral pulses normal, no pedal edema, no clubbing or cyanosis   ASSESSMENT AND PLAN: 1. Essential hypertension Not at goal.  Off meds for several mths. Encourage compliance to prevent compliacations - CBC - Comprehensive metabolic panel - Lipid panel - lisinopril-hydrochlorothiazide (ZESTORETIC) 20-12.5 MG tablet; Take 2 tablets by mouth daily.  Dispense: 180 tablet; Refill: 3 - amLODipine (NORVASC) 10 MG tablet; Take 1 tablet (10 mg total) by mouth every evening.  Dispense: 90 tablet; Refill: 3  2. Depression Patient declined meeting with our licensed clinical social worker today.  Given information of programs in the area where he can get counseling if he so desires including family services of the [place].  He denies suicidal ideation at this time.  Restart Zoloft.  Follow-up in 6 weeks. - sertraline (ZOLOFT) 50 MG tablet; Take 1 tablet (50 mg total) by mouth daily.  Dispense: 30 tablet; Refill: 6  3. Light smoker 4. Polysubstance abuse [facility]) -Encourage patient to get into a treatment program or attend AA meetings  Patient was given the opportunity to ask questions.  Patient verbalized understanding of the plan and was able to repeat key elements of the plan.   No orders of the defined types were placed in this encounter.    Requested Prescriptions    No prescriptions requested or ordered in this encounter    No follow-ups  on file.  Jonah Blue, MD, FACP

## 2017-06-03 LAB — CBC
HEMOGLOBIN: 14.1 g/dL (ref 13.0–17.7)
Hematocrit: 43.1 % (ref 37.5–51.0)
MCH: 29.9 pg (ref 26.6–33.0)
MCHC: 32.7 g/dL (ref 31.5–35.7)
MCV: 92 fL (ref 79–97)
Platelets: 188 10*3/uL (ref 150–379)
RBC: 4.71 x10E6/uL (ref 4.14–5.80)
RDW: 14.7 % (ref 12.3–15.4)
WBC: 10.6 10*3/uL (ref 3.4–10.8)

## 2017-06-03 LAB — COMPREHENSIVE METABOLIC PANEL
A/G RATIO: 1.5 (ref 1.2–2.2)
ALBUMIN: 4 g/dL (ref 3.5–5.5)
ALK PHOS: 61 IU/L (ref 39–117)
ALT: 18 IU/L (ref 0–44)
AST: 18 IU/L (ref 0–40)
BUN / CREAT RATIO: 11 (ref 9–20)
BUN: 14 mg/dL (ref 6–24)
Bilirubin Total: <0.2 mg/dL (ref 0.0–1.2)
CO2: 20 mmol/L (ref 20–29)
Calcium: 9 mg/dL (ref 8.7–10.2)
Chloride: 109 mmol/L — ABNORMAL HIGH (ref 96–106)
Creatinine, Ser: 1.3 mg/dL — ABNORMAL HIGH (ref 0.76–1.27)
GFR calc non Af Amer: 65 mL/min/{1.73_m2} (ref >59)
GFR, EST AFRICAN AMERICAN: 75 mL/min/{1.73_m2} (ref >59)
GLOBULIN, TOTAL: 2.7 g/dL (ref 1.5–4.5)
Glucose: 103 mg/dL — ABNORMAL HIGH (ref 65–99)
POTASSIUM: 4.1 mmol/L (ref 3.5–5.2)
SODIUM: 144 mmol/L (ref 134–144)
TOTAL PROTEIN: 6.7 g/dL (ref 6.0–8.5)

## 2017-06-03 LAB — LIPID PANEL
CHOLESTEROL TOTAL: 144 mg/dL (ref 100–199)
Chol/HDL Ratio: 2.9 ratio (ref 0.0–5.0)
HDL: 49 mg/dL (ref >39)
LDL Calculated: 71 mg/dL (ref 0–99)
Triglycerides: 120 mg/dL (ref 0–149)
VLDL CHOLESTEROL CAL: 24 mg/dL (ref 5–40)

## 2017-06-06 ENCOUNTER — Telehealth: Payer: Self-pay

## 2017-06-06 NOTE — Telephone Encounter (Signed)
Contacted pt to go over lab results pt didn't answer left a detailed vm informing pt of results and if he has any questions or concerns to give me a call  If pt calls back please give results: blood count is good meaning no anemia. Cholesterol profile normal. Kidney function is not 100%. This is something we will monitor on f/u visit. LFT normal.

## 2017-06-14 ENCOUNTER — Emergency Department (HOSPITAL_COMMUNITY)
Admission: EM | Admit: 2017-06-14 | Discharge: 2017-06-14 | Disposition: A | Discharge status: Home / Self Care | Payer: Self-pay | Attending: Emergency Medicine | Admitting: Emergency Medicine

## 2017-06-14 ENCOUNTER — Encounter (HOSPITAL_COMMUNITY): Payer: Self-pay

## 2017-06-14 ENCOUNTER — Other Ambulatory Visit: Payer: Self-pay

## 2017-06-14 DIAGNOSIS — F329 Major depressive disorder, single episode, unspecified: Secondary | ICD-10-CM | POA: Insufficient documentation

## 2017-06-14 DIAGNOSIS — Z79899 Other long term (current) drug therapy: Secondary | ICD-10-CM | POA: Insufficient documentation

## 2017-06-14 DIAGNOSIS — F1910 Other psychoactive substance abuse, uncomplicated: Secondary | ICD-10-CM | POA: Insufficient documentation

## 2017-06-14 DIAGNOSIS — I10 Essential (primary) hypertension: Secondary | ICD-10-CM | POA: Insufficient documentation

## 2017-06-14 DIAGNOSIS — F17210 Nicotine dependence, cigarettes, uncomplicated: Secondary | ICD-10-CM | POA: Insufficient documentation

## 2017-06-14 LAB — COMPREHENSIVE METABOLIC PANEL
ALK PHOS: 67 U/L (ref 38–126)
ALT: 23 U/L (ref 17–63)
AST: 22 U/L (ref 15–41)
Albumin: 4.1 g/dL (ref 3.5–5.0)
Anion gap: 13 (ref 5–15)
BUN: 9 mg/dL (ref 6–20)
CALCIUM: 9.6 mg/dL (ref 8.9–10.3)
CO2: 23 mmol/L (ref 22–32)
Chloride: 101 mmol/L (ref 101–111)
Creatinine, Ser: 1.18 mg/dL (ref 0.61–1.24)
GFR calc Af Amer: >60 mL/min (ref >60)
Glucose, Bld: 85 mg/dL (ref 65–99)
Potassium: 3.7 mmol/L (ref 3.5–5.1)
Sodium: 137 mmol/L (ref 135–145)
Total Bilirubin: 0.8 mg/dL (ref 0.3–1.2)
Total Protein: 8.4 g/dL — ABNORMAL HIGH (ref 6.5–8.1)

## 2017-06-14 LAB — CBC
HCT: 42.3 % (ref 39.0–52.0)
Hemoglobin: 15 g/dL (ref 13.0–17.0)
MCH: 30.7 pg (ref 26.0–34.0)
MCHC: 35.5 g/dL (ref 30.0–36.0)
MCV: 86.5 fL (ref 78.0–100.0)
PLATELETS: 238 10*3/uL (ref 150–400)
RBC: 4.89 MIL/uL (ref 4.22–5.81)
RDW: 13.6 % (ref 11.5–15.5)
WBC: 13.5 10*3/uL — AB (ref 4.0–10.5)

## 2017-06-14 LAB — ETHANOL

## 2017-06-14 NOTE — ED Triage Notes (Signed)
Pt reports he uses cocaine and marijuana. Pt states he is wanting to receive inpatient treatment for drug problem. Last used drugs this morning. Pt pleasant and cooperative in triage.

## 2017-06-14 NOTE — Discharge Instructions (Addendum)
Please use the resource guide attached for substance abuse support.

## 2017-06-14 NOTE — ED Provider Notes (Signed)
[facility] EMERGENCY DEPARTMENT Provider Note   CSN: [id number] Arrival date & time: 06/14/17  4098     History   Chief Complaint Chief Complaint  Patient presents with  . Drug Problem    HPI MCKADE GURKA is a 47 y.o. male.  HPI   Mr. Reynoldo is a 47 year old male with a history of hypertension who presents emergency department with request for outpatient resources for substance abuse.  Patient reports that his life got off track several years ago.  He denies daily alcohol consumption, but states he has about 5-10 beers per week.  He also reports regular cocaine use, last use yesterday.  Has used marijuana in the past, but denies this recently.  Patient states that he is ready to make changes in his life and does not want to use drugs or alcohol any longer.  He is asking specifically for outpatient resources for substance abuse.  Denies SI/HI.  Denies auditory visual hallucinations.  Denies history of withdrawal seizures or DT.  He has no physical complaints.  Denies fever, chills, sore throat, congestion, headache, chest pain, shortness of breath, cough, abdominal pain, nausea/vomiting, diarrhea, dysuria, urinary frequency, flank pain, lightheadedness or syncope.  He recently started going to [facility], reports he has a follow-up appointment in a few weeks.  Past Medical History:  Diagnosis Date  . Hypertension 2000    Patient Active Problem List   Diagnosis Date Noted  . Light smoker 08/29/2015  . HTN (hypertension) 09/12/2014  . Depression 09/12/2014  . Decreased sex drive 11/91/4782    History reviewed. No pertinent surgical history.      Home Medications    Prior to Admission medications   Medication Sig Start Date End Date Taking? Authorizing Provider  amLODipine (NORVASC) 10 MG tablet Take 1 tablet (10 mg total) by mouth every evening. 06/02/17   Marcine Matar, MD  lisinopril-hydrochlorothiazide (ZESTORETIC) 20-12.5 MG tablet Take 2  tablets by mouth daily. 06/02/17   Marcine Matar, MD  sertraline (ZOLOFT) 50 MG tablet Take 1 tablet (50 mg total) by mouth daily. 06/02/17   Marcine Matar, MD    Family History Family History  Problem Relation Age of Onset  . Hypertension Mother   . Hypertension Maternal Grandmother   . Diabetes Maternal Grandmother   . Diabetes Paternal Grandmother     Social History Social History   Tobacco Use  . Smoking status: Current Some Day Smoker    Types: Cigarettes  . Smokeless tobacco: Never Used  Substance Use Topics  . Alcohol use: Yes    Alcohol/week: 2.4 oz    Types: 2 Cans of beer, 2 Shots of liquor per week    Comment: on weekends   . Drug use: No    Types: Cocaine     Allergies   Penicillins; Citric acid; and Other   Review of Systems Review of Systems  Constitutional: Negative for chills and fever.  Eyes: Negative for visual disturbance.  Respiratory: Negative for shortness of breath.   Cardiovascular: Negative for chest pain.  Gastrointestinal: Negative for abdominal pain, nausea and vomiting.  Genitourinary: Negative for difficulty urinating.  Musculoskeletal: Negative for back pain.  Skin: Negative for rash.  Neurological: Negative for weakness, light-headedness, numbness and headaches.  Psychiatric/Behavioral: Negative for agitation, self-injury and suicidal ideas.     Physical Exam Updated Vital Signs BP (!) 143/91 (BP Location: Right Arm)   Pulse 83   Temp 98.4 F (36.9 C) (Oral)  Resp 16   SpO2 96%   Physical Exam  Constitutional: He is oriented to person, place, and time. He appears well-developed and well-nourished. No distress.  NAD, non-toxic.  HENT:  Head: Normocephalic and atraumatic.  Mouth/Throat: Oropharynx is clear and moist.  Eyes: Pupils are equal, round, and reactive to light. Right eye exhibits no discharge. Left eye exhibits no discharge.  Neck: Normal range of motion. Neck supple.  Cardiovascular: Normal rate and  regular rhythm.  No murmur heard. Pulmonary/Chest: Effort normal and breath sounds normal. No stridor. No respiratory distress. He has no wheezes. He has no rales.  Abdominal: Soft. There is no tenderness.  Neurological: He is alert and oriented to person, place, and time. Coordination normal.  Skin: Skin is warm and dry. He is not diaphoretic.  Psychiatric: He has a normal mood and affect. His behavior is normal.  Appears well kept. Voice appropriate speed and volume. Good articulation. No SI/HI. No apparent delusions or hallucinations.   Nursing note and vitals reviewed.    ED Treatments / Results  Labs (all labs ordered are listed, but only abnormal results are displayed) Labs Reviewed  COMPREHENSIVE METABOLIC PANEL - Abnormal; Notable for the following components:      Result Value   Total Protein 8.4 (*)    All other components within normal limits  CBC - Abnormal; Notable for the following components:   WBC 13.5 (*)    All other components within normal limits  ETHANOL    EKG None  Radiology No results found.  Procedures Procedures (including critical care time)  Medications Ordered in ED Medications - No data to display   Initial Impression / Assessment and Plan / ED Course  I have reviewed the triage vital signs and the nursing notes.  Pertinent labs & imaging results that were available during my care of the patient were reviewed by me and considered in my medical decision making (see chart for details).     Presents to the emergency department requesting outpatient resources for substance abuse.  He has no physical complaints.  Denies SI/HI.  No apparent delusions or hallucinations.  He denies history of alcohol withdrawal symptoms or DT.  He did get lab work during triage which shows he has a mild leukocytosis of 13.5. He denies any infectious symptoms, VSS. Feel that this is likely reactive and will not proceed with further imaging/lab work at this time given  presentation.  His vital signs are stable. CMP without any major electrolyte abnormalities, kidney function normal and liver enzymes normal. Ethanol negative. Patient will be discharged with resource guide to substance abuse centers.  His blood pressure was elevated in the ER today, counseled him that this rechecked at his next appointment with [facility].  Patient agrees and voiced understanding to the above plan and has no complaints prior to discharge.  Final Clinical Impressions(s) / ED Diagnoses   Final diagnoses:  Polysubstance abuse [facility])    ED Discharge Orders    None       Lawrence Marseilles 06/14/17 1515    Tegeler, Canary Brim, MD 06/14/17 2351

## 2017-06-14 NOTE — ED Notes (Signed)
Pt stable, ambulatory, states understanding of discharge instructions 

## 2017-07-02 ENCOUNTER — Ambulatory Visit (HOSPITAL_COMMUNITY)
Admission: RE | Admit: 2017-07-02 | Discharge: 2017-07-02 | Disposition: A | Discharge status: Home / Self Care | Payer: No Typology Code available for payment source | Attending: Psychiatry | Admitting: Psychiatry

## 2017-07-02 DIAGNOSIS — F329 Major depressive disorder, single episode, unspecified: Secondary | ICD-10-CM | POA: Diagnosis present

## 2017-07-02 NOTE — BH Assessment (Signed)
Assessment Note  Joshua Daugherty is a 47 y.o. male who came to [place] due to an addiction to cocaine and EoTH. Pt shares he is homeless and he has been using an 8-ball - a 4th and has been drinking a case-and-a-half (18 beers) every 24 hours. Pt shares he would like to be admitted to stop using these substances.  Pt denies SI, AVH, and NSSIB. Pt shares he has been having some angry feelings towards some people but refuses to share towards whom and what he is thinking. Pt does not, however, state he is planning on killing anyone. Pt expresses feeling depressed and states  He has been feeling "angry, sad, and low." Pt again denies feeling suicidal or that he has a plan to kill himself.  Pt states he works. He denies he has previously received SA treatment and shares he has been hospitalized at [facility] for depression, though he was unable to recall when that was. He states he is not currently seeing a therapist nor a psychiatrist.  Pt is oriented x4. His recent and remote memory is intact. Pt was cooperative throughout the assessment, though became frustrated when informed that he did not meet inpatient criteria and there were no local SATCs / detox centers that currently have beds available. Pt was provided information for 3 locations; 1 has a bed available immediately and 2 have beds available tomorrow. Pt expressed an understanding regarding this information.   Diagnosis: F14.24, Cocaine-induced depressive disorder, With moderate or severe use disorder   Past Medical History:  Past Medical History:  Diagnosis Date  . Hypertension 2000    No past surgical history on file.  Family History:  Family History  Problem Relation Age of Onset  . Hypertension Mother   . Hypertension Maternal Grandmother   . Diabetes Maternal Grandmother   . Diabetes Paternal Grandmother     Social History:  reports that he has been smoking cigarettes.  He has never used smokeless tobacco. He  reports that he drinks about 2.4 oz of alcohol per week. He reports that he does not use drugs.  Additional Social History:  Alcohol / Drug Use Pain Medications: Please see MAR Prescriptions: Please see MAR Over the Counter: Please see MAR History of alcohol / drug use?: Yes Longest period of sobriety (when/how long): Unknown Substance #1 Name of Substance 1: Cocaine 1 - Age of First Use: 13 1 - Amount (size/oz): "A lot" - "8 ball or quarter" 1 - Frequency: Over a 24 hour period 1 - Duration: Unknown 1 - Last Use / Amount: 07/01/17 Substance #2 Name of Substance 2: Alcohol 2 - Age of First Use: 13 2 - Amount (size/oz): "A case 1/2" 2 - Frequency: Over a 24 hour period 2 - Duration: Unknown 2 - Last Use / Amount: 07/02/17  CIWA: CIWA-Ar BP: (!) 144/83 Pulse Rate: 97 COWS:    Allergies:  Allergies  Allergen Reactions  . Penicillins Anaphylaxis  . Citric Acid Hives    High citric acid content foods  . Other Hives, Itching and Rash    "powder" in gloves-no latex allergy    Home Medications:  (Not in a hospital admission)  OB/GYN Status:  No LMP for male patient.  General Assessment Data Location of Assessment: [facility] Assessment Services TTS Assessment: In system Is this a Tele or Face-to-Face Assessment?: Face-to-Face Is this an Initial Assessment or a Re-assessment for this encounter?: Initial Assessment Marital status: Separated Elwin Sleight name: Leanos Is patient  pregnant?: No Pregnancy Status: No Living Arrangements: Other (Comment)(Homeless) Can pt return to current living arrangement?: Yes Admission Status: Voluntary Is patient capable of signing voluntary admission?: Yes Referral Source: Self/Family/Friend Insurance type: None  Medical Screening Exam ([place]) Medical Exam completed: Yes  Crisis Care Plan Living Arrangements: Other (Comment)(Homeless) Legal Guardian: Other:(N/A) Name of Psychiatrist: N/A Name of Therapist: N/A  Education Status Is  patient currently in school?: No Is the patient employed, unemployed or receiving disability?: Employed  Risk to self with the past 6 months Suicidal Ideation: No Has patient been a risk to self within the past 6 months prior to admission? : No Suicidal Intent: No Has patient had any suicidal intent within the past 6 months prior to admission? : No Is patient at risk for suicide?: No Suicidal Plan?: No Has patient had any suicidal plan within the past 6 months prior to admission? : No Access to Means: No What has been your use of drugs/alcohol within the last 12 months?: Pt admits to cocaine and EoTH use Previous Attempts/Gestures: No How many times?: 0 Other Self Harm Risks: Pt is homeless Triggers for Past Attempts: None known Intentional Self Injurious Behavior: None Family Suicide History: No Recent stressful life event(s): Conflict (Comment)(Pt is separated from his wife) Persecutory voices/beliefs?: No Depression: Yes Depression Symptoms: Feeling angry/irritable, Despondent, Guilt Substance abuse history and/or treatment for substance abuse?: No Suicide prevention information given to non-admitted patients: Not applicable  Risk to Others within the past 6 months Homicidal Ideation: Yes-Currently Present Does patient have any lifetime risk of violence toward others beyond the six months prior to admission? : No Thoughts of Harm to Others: Yes-Currently Present Comment - Thoughts of Harm to Others: Pt states he has anger towards others but has no plan/details Current Homicidal Intent: No(Pt denies) Current Homicidal Plan: No Access to Homicidal Means: No Identified Victim: Pt refuses to provide names/details History of harm to others?: No(Pt denies) Assessment of Violence: On admission Violent Behavior Description: Pt shares he is angry but shares no plan/details Does patient have access to weapons?: No(Pt denies) Criminal Charges Pending?: No Does patient have a court  date: No Is patient on probation?: No  Psychosis Hallucinations: None noted Delusions: None noted  Mental Status Report Appearance/Hygiene: Body odor, Disheveled Eye Contact: Fair Motor Activity: Psychomotor retardation, Shuffling Speech: Logical/coherent, Slow Level of Consciousness: Quiet/awake, Drowsy Mood: Empty, Sullen Affect: Sullen Anxiety Level: None Thought Processes: Coherent Judgement: Impaired Orientation: Person, Place, Time, Situation Obsessive Compulsive Thoughts/Behaviors: None  Cognitive Functioning Concentration: Normal Memory: Recent Intact, Remote Intact Is patient IDD: No Is patient DD?: Yes Insight: Poor Impulse Control: Poor Appetite: Good Have you had any weight changes? : No Change Sleep: Decreased Total Hours of Sleep: 5 Vegetative Symptoms: None  ADLScreening [facility] Assessment Services) Patient's cognitive ability adequate to safely complete daily activities?: Yes Patient able to express need for assistance with ADLs?: Yes Independently performs ADLs?: Yes (appropriate for developmental age)  Prior Inpatient Therapy Prior Inpatient Therapy: Yes Prior Therapy Dates: Unknown Prior Therapy Facilty/Provider(s): [facility] Reason for Treatment: Depression  Prior Outpatient Therapy Prior Outpatient Therapy: No Does patient have an ACCT team?: No Does patient have Intensive In-House Services?  : No Does patient have [facility] services? : No Does patient have P4CC services?: No  ADL Screening (condition at time of admission) Patient's cognitive ability adequate to safely complete daily activities?: Yes Is the patient deaf or have difficulty hearing?: No Does the patient have difficulty seeing, even  when wearing glasses/contacts?: No Does the patient have difficulty concentrating, remembering, or making decisions?: No Patient able to express need for assistance with ADLs?: Yes Does the patient have difficulty dressing or bathing?:  No Independently performs ADLs?: Yes (appropriate for developmental age) Does the patient have difficulty walking or climbing stairs?: No       Abuse/Neglect Assessment (Assessment to be complete while patient is alone) Abuse/Neglect Assessment Can Be Completed: Yes Physical Abuse: Denies Verbal Abuse: Denies Sexual Abuse: Denies Exploitation of patient/patient's resources: Denies Self-Neglect: Denies Values / Beliefs Cultural Requests During Hospitalization: None Spiritual Requests During Hospitalization: None Consults Spiritual Care Consult Needed: No Social Work Consult Needed: No      Additional Information 1:1 In Past 12 Months?: No CIRT Risk: No Elopement Risk: No Does patient have medical clearance?: Yes     Disposition: Marvia Pickles NP reviewed pt's chart and information and met with pt and determined pt does not meet the criteria for inpt hospitalization. Pt was provided information for 3 detox/treatment centers.   Disposition Initial Assessment Completed for this Encounter: Yes Disposition of Patient: Discharge(Travis Money NP determined pt doesn't meet inpt criteria) Patient refused recommended treatment: No Mode of transportation if patient is discharged?: Car Patient referred to: [facility], Other (Comment)([facility], [facility])  On Site Evaluation by:   Reviewed with Physician:    Dannielle Burn 07/02/2017 2:12 PM

## 2017-07-02 NOTE — H&P (Signed)
Behavioral Health Medical Screening Exam  Joshua Daugherty is an 47 y.o. male.  Total Time spent with patient: 20 minutes  Psychiatric Specialty Exam: Physical Exam  Nursing note and vitals reviewed. Constitutional: He is oriented to person, place, and time. He appears well-developed and well-nourished.  Cardiovascular: Normal rate.  Respiratory: Effort normal.  Musculoskeletal: Normal range of motion.  Neurological: He is alert and oriented to person, place, and time.  Skin: Skin is warm.    Review of Systems  Constitutional: Negative.   HENT: Negative.   Eyes: Negative.   Respiratory: Negative.   Cardiovascular: Negative.   Gastrointestinal: Negative.   Genitourinary: Negative.   Musculoskeletal: Negative.   Skin: Negative.   Neurological: Negative.   Endo/Heme/Allergies: Negative.   Psychiatric/Behavioral: Positive for depression and substance abuse. Negative for hallucinations and suicidal ideas.    Blood pressure (!) 144/83, pulse 97, temperature 98.1 F (36.7 C), resp. rate 18, SpO2 97 %.There is no height or weight on file to calculate BMI.  General Appearance: Disheveled  Eye Contact:  Good  Speech:  Clear and Coherent and Normal Rate  Volume:  Normal  Mood:  Depressed  Affect:  Flat  Thought Process:  Linear and Descriptions of Associations: Intact  Orientation:  Full (Time, Place, and Person)  Thought Content:  WDL  Suicidal Thoughts:  No  Homicidal Thoughts:  No  Memory:  Immediate;   Good Recent;   Good Remote;   Good  Judgement:  Fair  Insight:  Fair  Psychomotor Activity:  Normal  Concentration: Concentration: Good and Attention Span: Good  Recall:  Good  Fund of Knowledge:Good  Language: Good  Akathisia:  No  Handed:  Right  AIMS (if indicated):     Assets:  Communication Skills Desire for Improvement Social Support  Sleep:       Musculoskeletal: Strength & Muscle Tone: within normal limits Gait & Station: normal Patient leans:  N/A  Blood pressure (!) 144/83, pulse 97, temperature 98.1 F (36.7 C), resp. rate 18, SpO2 97 %.  Recommendations:  Based on my evaluation the patient does not appear to have an emergency medical condition.  Gerlene Burdock Sahily Biddle, FNP 07/02/2017, 1:23 PM

## 2018-06-30 ENCOUNTER — Other Ambulatory Visit: Payer: Self-pay | Admitting: Internal Medicine

## 2018-06-30 DIAGNOSIS — I10 Essential (primary) hypertension: Secondary | ICD-10-CM

## 2018-10-07 ENCOUNTER — Other Ambulatory Visit: Payer: Self-pay | Admitting: Internal Medicine

## 2018-10-07 DIAGNOSIS — I10 Essential (primary) hypertension: Secondary | ICD-10-CM

## 2019-04-04 MED FILL — LISINOPRIL-HCTZ 20-25 MG TA: 20-25 | 90 days supply | Qty: 90 | Fill #0

## 2019-04-04 MED FILL — SERTRALINE HCL 50 MG TABLET: 50 | 90 days supply | Qty: 90 | Fill #0

## 2019-04-04 MED FILL — AMLODIPINE BESYLATE 10 MG T: 10 | 90 days supply | Qty: 90 | Fill #0
